# Patient Record
Sex: Male | Born: 1945
Health system: Southern US, Community
[De-identification: ages and names within clinical notes are randomized; demographics above are authoritative.]

## PROBLEM LIST (undated history)

## (undated) DIAGNOSIS — I10 Essential (primary) hypertension: Secondary | ICD-10-CM

## (undated) DIAGNOSIS — M1990 Unspecified osteoarthritis, unspecified site: Secondary | ICD-10-CM

## (undated) DIAGNOSIS — T7840XA Allergy, unspecified, initial encounter: Secondary | ICD-10-CM

## (undated) DIAGNOSIS — E669 Obesity, unspecified: Secondary | ICD-10-CM

## (undated) DIAGNOSIS — E7800 Pure hypercholesterolemia, unspecified: Secondary | ICD-10-CM

## (undated) HISTORY — DX: Obesity, unspecified: E66.9

## (undated) HISTORY — DX: Pure hypercholesterolemia, unspecified: E78.00

## (undated) HISTORY — DX: Unspecified osteoarthritis, unspecified site: M19.90

## (undated) HISTORY — DX: Allergy, unspecified, initial encounter: T78.40XA

## (undated) HISTORY — DX: Essential (primary) hypertension: I10

---

## 2003-04-02 ENCOUNTER — Encounter: Admission: RE | Admit: 2003-04-02 | Discharge: 2003-04-02 | Payer: Self-pay | Admitting: Family Medicine

## 2007-08-09 ENCOUNTER — Ambulatory Visit: Payer: Self-pay | Admitting: Family Medicine

## 2007-09-04 ENCOUNTER — Ambulatory Visit: Payer: Self-pay | Admitting: Family Medicine

## 2007-09-24 ENCOUNTER — Encounter (INDEPENDENT_AMBULATORY_CARE_PROVIDER_SITE_OTHER): Payer: Self-pay | Admitting: *Deleted

## 2007-09-24 ENCOUNTER — Ambulatory Visit: Payer: Self-pay | Admitting: Gastroenterology

## 2007-10-08 ENCOUNTER — Ambulatory Visit: Payer: Self-pay | Admitting: Gastroenterology

## 2008-04-14 ENCOUNTER — Ambulatory Visit: Payer: Self-pay | Admitting: Family Medicine

## 2008-07-01 ENCOUNTER — Ambulatory Visit: Payer: Self-pay | Admitting: Family Medicine

## 2008-10-03 ENCOUNTER — Ambulatory Visit: Payer: Self-pay | Admitting: Family Medicine

## 2008-12-04 ENCOUNTER — Ambulatory Visit: Payer: Self-pay | Admitting: Family Medicine

## 2008-12-25 ENCOUNTER — Ambulatory Visit: Payer: Self-pay | Admitting: Family Medicine

## 2009-01-13 ENCOUNTER — Ambulatory Visit: Payer: Self-pay | Admitting: Family Medicine

## 2009-02-14 HISTORY — PX: EYE SURGERY: SHX253

## 2009-07-15 ENCOUNTER — Ambulatory Visit: Payer: Self-pay | Admitting: Family Medicine

## 2009-07-15 ENCOUNTER — Encounter: Admission: RE | Admit: 2009-07-15 | Discharge: 2009-07-15 | Payer: Self-pay | Admitting: Family Medicine

## 2009-07-23 ENCOUNTER — Ambulatory Visit: Payer: Self-pay | Admitting: Family Medicine

## 2009-08-20 ENCOUNTER — Ambulatory Visit: Payer: Self-pay | Admitting: Family Medicine

## 2010-02-10 ENCOUNTER — Ambulatory Visit
Admission: RE | Admit: 2010-02-10 | Discharge: 2010-02-10 | Payer: Self-pay | Source: Home / Self Care | Attending: Family Medicine | Admitting: Family Medicine

## 2010-07-24 ENCOUNTER — Other Ambulatory Visit: Payer: Self-pay | Admitting: Family Medicine

## 2010-12-07 ENCOUNTER — Other Ambulatory Visit: Payer: Self-pay | Admitting: Family Medicine

## 2010-12-07 NOTE — Telephone Encounter (Signed)
Patient needs an Office Visit in order to get any more refills.

## 2010-12-09 ENCOUNTER — Encounter: Payer: Self-pay | Admitting: Family Medicine

## 2010-12-10 ENCOUNTER — Ambulatory Visit (INDEPENDENT_AMBULATORY_CARE_PROVIDER_SITE_OTHER): Payer: Medicare Other | Admitting: Family Medicine

## 2010-12-10 ENCOUNTER — Encounter: Payer: Self-pay | Admitting: Family Medicine

## 2010-12-10 VITALS — BP 122/82 | HR 62 | Wt 227.0 lb

## 2010-12-10 DIAGNOSIS — Z125 Encounter for screening for malignant neoplasm of prostate: Secondary | ICD-10-CM

## 2010-12-10 DIAGNOSIS — Z23 Encounter for immunization: Secondary | ICD-10-CM

## 2010-12-10 DIAGNOSIS — E669 Obesity, unspecified: Secondary | ICD-10-CM

## 2010-12-10 DIAGNOSIS — Z79899 Other long term (current) drug therapy: Secondary | ICD-10-CM

## 2010-12-10 DIAGNOSIS — E785 Hyperlipidemia, unspecified: Secondary | ICD-10-CM

## 2010-12-10 DIAGNOSIS — I10 Essential (primary) hypertension: Secondary | ICD-10-CM

## 2010-12-10 LAB — LIPID PANEL
Cholesterol: 202 mg/dL — ABNORMAL HIGH (ref 0–200)
LDL Cholesterol: 145 mg/dL — ABNORMAL HIGH (ref 0–99)
Total CHOL/HDL Ratio: 5.1 Ratio
Triglycerides: 86 mg/dL (ref <150)
VLDL: 17 mg/dL (ref 0–40)

## 2010-12-10 LAB — CBC WITH DIFFERENTIAL/PLATELET
Basophils Relative: 1 % (ref 0–1)
Eosinophils Absolute: 0.2 10*3/uL (ref 0.0–0.7)
Eosinophils Relative: 3 % (ref 0–5)
Lymphs Abs: 1.7 10*3/uL (ref 0.7–4.0)
MCH: 27.3 pg (ref 26.0–34.0)
MCHC: 32.8 g/dL (ref 30.0–36.0)
MCV: 83.3 fL (ref 78.0–100.0)
Neutrophils Relative %: 61 % (ref 43–77)
Platelets: 226 10*3/uL (ref 150–400)

## 2010-12-10 LAB — COMPREHENSIVE METABOLIC PANEL
ALT: 16 U/L (ref 0–53)
Alkaline Phosphatase: 67 U/L (ref 39–117)
CO2: 22 mEq/L (ref 19–32)
Creat: 1.67 mg/dL — ABNORMAL HIGH (ref 0.50–1.35)
Glucose, Bld: 93 mg/dL (ref 70–99)
Sodium: 136 mEq/L (ref 135–145)
Total Bilirubin: 0.5 mg/dL (ref 0.3–1.2)
Total Protein: 7 g/dL (ref 6.0–8.3)

## 2010-12-10 MED ORDER — LISINOPRIL-HYDROCHLOROTHIAZIDE 10-12.5 MG PO TABS: 1.0000 | ORAL_TABLET | Freq: Every day | ORAL | Status: DC

## 2010-12-10 NOTE — Progress Notes (Signed)
  Subjective:    Patient ID: Stephen Padilla, male    DOB: 1945-03-09, 65 y.o.   MRN: [id number]  HPI Is here for an interval evaluation. He continues on his lisinopril and is having no difficulty with this. He has no other concerns or complaints. He does have a history of hyperlipidemia but presently is on no medications. He did have a previous PSA test. He is semiretired and enjoying himself. His marriage is going well. He does not smoke.   Review of Systems     Objective:   Physical Exam alert and in no distress. Tympanic membranes and canals are normal. Throat is clear. Tonsils are normal. Neck is supple without adenopathy or thyromegaly. Cardiac exam shows a regular sinus rhythm without murmurs or gallops. Lungs are clear to auscultation.        Assessment & Plan:   1. Hypertension  CBC with Differential, Comprehensive metabolic panel  2. Hyperlipidemia LDL goal < 100  Lipid panel  3. Encounter for long-term (current) use of other medications    4. Obesity (BMI 30-39.9)    5. Special screening for malignant neoplasm of prostate  PSA  6. Need for prophylactic vaccination and inoculation against influenza

## 2010-12-10 NOTE — Patient Instructions (Signed)
Continue take good care of yourself. Your blood pressure medicine has been called in

## 2010-12-17 ENCOUNTER — Encounter: Payer: Self-pay | Admitting: Family Medicine

## 2011-02-03 ENCOUNTER — Encounter: Payer: Self-pay | Admitting: Family Medicine

## 2011-02-03 ENCOUNTER — Ambulatory Visit (INDEPENDENT_AMBULATORY_CARE_PROVIDER_SITE_OTHER): Payer: Medicare Other | Admitting: Family Medicine

## 2011-02-03 VITALS — BP 128/84 | HR 72 | Temp 98.0°F | Ht 69.0 in | Wt 230.0 lb

## 2011-02-03 DIAGNOSIS — J069 Acute upper respiratory infection, unspecified: Secondary | ICD-10-CM

## 2011-02-03 NOTE — Progress Notes (Signed)
Chief complaint: Cough runny nose, hard to swallow x 2 -3 days  HPI:  Symptoms began 2 days ago, with runny nose, postnasal drip, sore throat and slight cough.  Cough is dry.  Nasal mucus is clear.  Denies shortness of breath.  Denies fevers.  Patient is concerned because typically with these illnesses it then moves into his chest, gets bronchitis and needs antibiotics.  Hoping to avoid that this year.  Last year cough persisted x 6 weeks.  No sick contacts.  Has taken some Sudafed with some improvement.  Past Medical History  Diagnosis Date  . Obesity   . Hypercholesterolemia   . Hemorrhoids   . Alopecia   . Allergy     RHINITIS  . Hypertension   . Cataract    Past Surgical History  Procedure Date  . Eye surgery 2011    BILAT CATARACTS   History   Social History  . Marital Status: Married    Spouse Name: N/A    Number of Children: N/A  . Years of Education: N/A   Occupational History  . Not on file.   Social History Main Topics  . Smoking status: Never Smoker   . Smokeless tobacco: Never Used  . Alcohol Use: 0.0 oz/week     occasionally, maybe 1-2 drinks socially (usually once a month)  . Drug Use: No  . Sexually Active: Not on file   Other Topics Concern  . Not on file   Social History Narrative  . No narrative on file   Current Outpatient Prescriptions on File Prior to Visit  Medication Sig Dispense Refill  . lisinopril-hydrochlorothiazide (PRINZIDE,ZESTORETIC) 10-12.5 MG per tablet Take 1 tablet by mouth daily.  30 tablet  11   Allergies  Allergen Reactions  . Penicillins     REACTION: rash   ROS:  Denies fevers, rashes, nausea, vomiting, diarrhea, myalgias, chest pain, headaches or other concerns.  Had a headache yesterday, resolved  PHYSICAL EXAM: BP 128/84  Pulse 72  Temp(Src) 98 F (36.7 C) (Oral)  Ht 5\' 9"  (1.753 m)  Wt 230 lb (104.327 kg)  BMI 33.96 kg/m2 HEENT:  PERRL, EOMI, conjunctiva clear. TM's obscured by cerumen.  OP clear without  erythema or lesions.  Sinuses nontender.  Nasal mucosa only mildly edematous, clear-white mucus. Neck: no lymphadenopathy Heart: regular rate and rhythm without murmurs Lungs: clear bilaterally with good air movement Skin: no rashes  ASSESSMENT/PLAN:  1. URI (upper respiratory infection)    URI.  Supportive measures.  Continue decongestants, periodically monitoring BP's.  Discussed viral nature of current illness.  Antibiotics only useful for bacterial infections.  If symptoms persist or worsen beyond 7-10 days, then can call for possible antibiotics.  I recommend using Mucinex (guaifenesin) for cough, and if/when chest congestion begins

## 2011-02-03 NOTE — Patient Instructions (Signed)
Continue decongestants, periodically monitoring BP's.  Discussed viral nature of current illness.  Antibiotics only useful for bacterial infections.  If symptoms persist or worsen beyond 7-10 days, then can call for possible antibiotics.  I recommend using Mucinex (guaifenesin) for cough, and if/when chest congestion begins.  You may use a product containing dextromethorphan (ie Delsym syrup, or Mucinex DM) if you need a cough suppressant. If having significant shortness of breath, chest pain, high fevers--please return for re-evaluation (rather than calling for prescription)

## 2011-05-16 ENCOUNTER — Telehealth: Payer: Self-pay | Admitting: Internal Medicine

## 2011-05-16 MED ORDER — LISINOPRIL-HYDROCHLOROTHIAZIDE 10-12.5 MG PO TABS: 1.0000 | ORAL_TABLET | Freq: Every day | ORAL | Status: DC

## 2011-05-16 NOTE — Telephone Encounter (Signed)
Med sent in.

## 2011-05-17 ENCOUNTER — Telehealth: Payer: Self-pay | Admitting: Internal Medicine

## 2011-05-17 MED ORDER — LISINOPRIL-HYDROCHLOROTHIAZIDE 10-12.5 MG PO TABS: 1.0000 | ORAL_TABLET | Freq: Every day | ORAL | Status: DC

## 2011-05-17 NOTE — Telephone Encounter (Signed)
Request for a 90 day supply for lisinoprol-hctz 10-12.5mg . Yesterday it was done with #30 with 5rf and so i just changed its to #90 with 1 rf.

## 2012-01-12 ENCOUNTER — Other Ambulatory Visit: Payer: Self-pay | Admitting: Family Medicine

## 2012-01-23 ENCOUNTER — Encounter: Payer: Self-pay | Admitting: Family Medicine

## 2012-01-23 ENCOUNTER — Ambulatory Visit (INDEPENDENT_AMBULATORY_CARE_PROVIDER_SITE_OTHER): Payer: Medicare Other | Admitting: Family Medicine

## 2012-01-23 VITALS — BP 124/82 | HR 69 | Wt 217.0 lb

## 2012-01-23 DIAGNOSIS — E785 Hyperlipidemia, unspecified: Secondary | ICD-10-CM

## 2012-01-23 DIAGNOSIS — Z125 Encounter for screening for malignant neoplasm of prostate: Secondary | ICD-10-CM

## 2012-01-23 DIAGNOSIS — I10 Essential (primary) hypertension: Secondary | ICD-10-CM

## 2012-01-23 DIAGNOSIS — E669 Obesity, unspecified: Secondary | ICD-10-CM

## 2012-01-23 DIAGNOSIS — Z23 Encounter for immunization: Secondary | ICD-10-CM

## 2012-01-23 DIAGNOSIS — Z79899 Other long term (current) drug therapy: Secondary | ICD-10-CM

## 2012-01-23 LAB — COMPREHENSIVE METABOLIC PANEL
Albumin: 4.1 g/dL (ref 3.5–5.2)
Alkaline Phosphatase: 75 U/L (ref 39–117)
CO2: 29 mEq/L (ref 19–32)
Calcium: 9.4 mg/dL (ref 8.4–10.5)
Chloride: 96 mEq/L (ref 96–112)
Glucose, Bld: 84 mg/dL (ref 70–99)
Potassium: 4.1 mEq/L (ref 3.5–5.3)
Sodium: 138 mEq/L (ref 135–145)
Total Protein: 7.2 g/dL (ref 6.0–8.3)

## 2012-01-23 LAB — CBC WITH DIFFERENTIAL/PLATELET
Basophils Relative: 1 % (ref 0–1)
HCT: 41.4 % (ref 39.0–52.0)
Hemoglobin: 14.1 g/dL (ref 13.0–17.0)
Lymphocytes Relative: 30 % (ref 12–46)
Lymphs Abs: 2.3 10*3/uL (ref 0.7–4.0)
Monocytes Absolute: 0.5 10*3/uL (ref 0.1–1.0)
Monocytes Relative: 7 % (ref 3–12)
Neutro Abs: 4.3 10*3/uL (ref 1.7–7.7)
Neutrophils Relative %: 58 % (ref 43–77)
RBC: 5.13 MIL/uL (ref 4.22–5.81)

## 2012-01-23 LAB — LIPID PANEL
LDL Cholesterol: 132 mg/dL — ABNORMAL HIGH (ref 0–99)
VLDL: 44 mg/dL — ABNORMAL HIGH (ref 0–40)

## 2012-01-23 MED ORDER — INFLUENZA VIRUS VACC SPLIT PF IM SUSP: 0.5000 mL | Freq: Once | INTRAMUSCULAR | Status: DC

## 2012-01-23 MED ORDER — LISINOPRIL-HYDROCHLOROTHIAZIDE 10-12.5 MG PO TABS: 1.0000 | ORAL_TABLET | Freq: Every day | ORAL | Status: DC

## 2012-01-23 NOTE — Progress Notes (Signed)
  Subjective:    Patient ID: Stephen Padilla, male    DOB: 1945/05/30, 66 y.o.   MRN: [id number]  HPI He is here for an interval evaluation. He continues on lisinopril and is having no difficulty with this. He has no particular concerns or complaints. He would like to have a flu shot. Social history was reviewed. He does not smoke and drinks socially. He is semiretired and very much enjoying this. His immunizations were reviewed.  Review of Systems     Objective:   Physical Exam Alert and in no distress. Cardiac exam shows regular rhythm without murmurs or gallops. Lungs are clear to auscultation.       Assessment & Plan:   1. Special screening for malignant neoplasm of prostate  PSA, Medicare  2. Hypertension  CBC with Differential, Comprehensive metabolic panel, lisinopril-hydrochlorothiazide (PRINZIDE,ZESTORETIC) 10-12.5 MG per tablet  3. Hyperlipidemia LDL goal < 100  Lipid panel  4. Obesity (BMI 30-39.9)    5. Encounter for long-term (current) use of other medications  influenza  inactive virus vaccine (FLUZONE/FLUARIX) injection, Lipid panel, CBC with Differential, Comprehensive metabolic panel, lisinopril-hydrochlorothiazide (PRINZIDE,ZESTORETIC) 10-12.5 MG per tablet, DISCONTINUED: lisinopril-hydrochlorothiazide (PRINZIDE,ZESTORETIC) 10-12.5 MG per tablet  6. Need for prophylactic vaccination and inoculation against influenza  influenza  inactive virus vaccine (FLUZONE/FLUARIX) injection 0.5 mL   flu shot was given with risks and benefits discussed

## 2012-01-24 NOTE — Progress Notes (Signed)
Quick Note:  Renal functions are worse. Have him increase his fluid intake and we will recheck this in 2 or 3 weeks. Lipid panel is elevated but I will not change anything now ______

## 2012-07-26 ENCOUNTER — Other Ambulatory Visit: Payer: Self-pay | Admitting: Family Medicine

## 2012-09-19 ENCOUNTER — Other Ambulatory Visit: Payer: Self-pay

## 2012-12-20 ENCOUNTER — Other Ambulatory Visit: Payer: Self-pay

## 2013-01-29 ENCOUNTER — Other Ambulatory Visit: Payer: Self-pay | Admitting: Family Medicine

## 2013-05-26 ENCOUNTER — Other Ambulatory Visit: Payer: Self-pay | Admitting: Family Medicine

## 2013-05-27 NOTE — Telephone Encounter (Signed)
Pt coming in 05/2013

## 2013-05-27 NOTE — Telephone Encounter (Signed)
Is this ok to refill?  

## 2013-05-27 NOTE — Telephone Encounter (Signed)
Time for an office visit for him

## 2013-06-03 ENCOUNTER — Other Ambulatory Visit: Payer: Self-pay | Admitting: Family Medicine

## 2013-06-03 ENCOUNTER — Encounter: Payer: Self-pay | Admitting: Family Medicine

## 2013-06-03 ENCOUNTER — Ambulatory Visit (INDEPENDENT_AMBULATORY_CARE_PROVIDER_SITE_OTHER): Payer: 59 | Admitting: Family Medicine

## 2013-06-03 VITALS — BP 120/64 | HR 88 | Wt 235.0 lb

## 2013-06-03 DIAGNOSIS — I10 Essential (primary) hypertension: Secondary | ICD-10-CM

## 2013-06-03 DIAGNOSIS — E785 Hyperlipidemia, unspecified: Secondary | ICD-10-CM

## 2013-06-03 DIAGNOSIS — Z79899 Other long term (current) drug therapy: Secondary | ICD-10-CM

## 2013-06-03 DIAGNOSIS — E669 Obesity, unspecified: Secondary | ICD-10-CM

## 2013-06-03 MED ORDER — LISINOPRIL-HYDROCHLOROTHIAZIDE 10-12.5 MG PO TABS: 1.0000 | ORAL_TABLET | Freq: Every day | ORAL | Status: DC

## 2013-06-03 NOTE — Progress Notes (Signed)
   Subjective:    Patient ID: Stephen Padilla, male    DOB: 08/22/1945, 68 y.o.   MRN: [id number]  HPI  [name] is a very pleasant 68 y.o. yo male who  has a past medical history of Obesity; Hypercholesterolemia; Hemorrhoids; Alopecia; Allergy; Hypertension; and Cataract. He presents today for a med check   The patient is doing well today with no acute complaints. The patient continues to do well in both his job and social life and reports being happily married for the last 8 years. The patient does not use tobacco products and occasionally drinks on social occasions. The patient feels that his diet is overall good, he has mostly cut out all sugary sodas and now drinks only water. In terms of exercise the patient feels like he lives a healthy lifestyle and snjoys working outside. He plans to join a gym and loose 20 pounds soon in anticipation of his step-daughters wedding.   Colonoscopy: 2009 Shingles Administered Tetanus: UTD 23 valent Pneumococcal: Will give today.   Review of Systems is negative except per HPI.    Objective:   Physical Exam  Constitutional: Patient is oriented to person, place, and time and well-developed, well-nourished, and in no distress. Cardiovascular: Normal rate, regular rhythm. Exam reveals no murmurs, gallops and no friction rub.  Pulmonary/Chest: Effort normal and breath sounds normal. No respiratory distress. No wheezes or ronchi.   Psychiatric: Affect normal.   Assessment & Plan:  Encounter for long-term (current) use of other medications  Hypertension  Hyperlipidemia LDL goal < 100  Obesity (BMI 30-39.9)  Will renew his medications today, his BP appears to be well-controlled on his current regimen. Will do blood work today to recheck lipids.

## 2013-06-04 LAB — COMPREHENSIVE METABOLIC PANEL
ALBUMIN: 4.1 g/dL (ref 3.5–5.2)
ALK PHOS: 76 U/L (ref 39–117)
ALT: 18 U/L (ref 0–53)
AST: 21 U/L (ref 0–37)
BUN: 27 mg/dL — AB (ref 6–23)
CALCIUM: 9.1 mg/dL (ref 8.4–10.5)
CO2: 24 meq/L (ref 19–32)
Chloride: 98 mEq/L (ref 96–112)
Creat: 1.86 mg/dL — ABNORMAL HIGH (ref 0.50–1.35)
GLUCOSE: 130 mg/dL — AB (ref 70–99)
POTASSIUM: 4 meq/L (ref 3.5–5.3)
SODIUM: 135 meq/L (ref 135–145)
TOTAL PROTEIN: 6.9 g/dL (ref 6.0–8.3)
Total Bilirubin: 0.5 mg/dL (ref 0.2–1.2)

## 2013-06-04 LAB — CBC WITH DIFFERENTIAL/PLATELET
BASOS PCT: 1 % (ref 0–1)
Basophils Absolute: 0.1 10*3/uL (ref 0.0–0.1)
Eosinophils Absolute: 0.3 10*3/uL (ref 0.0–0.7)
Eosinophils Relative: 4 % (ref 0–5)
HEMATOCRIT: 42.7 % (ref 39.0–52.0)
HEMOGLOBIN: 14.6 g/dL (ref 13.0–17.0)
LYMPHS ABS: 2.3 10*3/uL (ref 0.7–4.0)
Lymphocytes Relative: 28 % (ref 12–46)
MCH: 28.1 pg (ref 26.0–34.0)
MCHC: 34.2 g/dL (ref 30.0–36.0)
MCV: 82.1 fL (ref 78.0–100.0)
MONO ABS: 0.5 10*3/uL (ref 0.1–1.0)
MONOS PCT: 6 % (ref 3–12)
NEUTROS ABS: 5.1 10*3/uL (ref 1.7–7.7)
Neutrophils Relative %: 61 % (ref 43–77)
Platelets: 226 10*3/uL (ref 150–400)
RBC: 5.2 MIL/uL (ref 4.22–5.81)
RDW: 15 % (ref 11.5–15.5)
WBC: 8.3 10*3/uL (ref 4.0–10.5)

## 2013-06-04 LAB — LIPID PANEL
CHOLESTEROL: 217 mg/dL — AB (ref 0–200)
HDL: 36 mg/dL — AB (ref >39)
LDL CALC: 115 mg/dL — AB (ref 0–99)
TRIGLYCERIDES: 329 mg/dL — AB (ref <150)
Total CHOL/HDL Ratio: 6 Ratio
VLDL: 66 mg/dL — ABNORMAL HIGH (ref 0–40)

## 2013-06-04 LAB — HEMOGLOBIN A1C
Hgb A1c MFr Bld: 6.2 % — ABNORMAL HIGH (ref <5.7)
Mean Plasma Glucose: 131 mg/dL — ABNORMAL HIGH (ref <117)

## 2013-08-19 ENCOUNTER — Encounter: Payer: Self-pay | Admitting: Family Medicine

## 2013-08-19 ENCOUNTER — Ambulatory Visit (INDEPENDENT_AMBULATORY_CARE_PROVIDER_SITE_OTHER): Payer: 59 | Admitting: Family Medicine

## 2013-08-19 VITALS — BP 110/70 | HR 91 | Wt 229.0 lb

## 2013-08-19 DIAGNOSIS — L821 Other seborrheic keratosis: Secondary | ICD-10-CM

## 2013-08-19 DIAGNOSIS — L309 Dermatitis, unspecified: Secondary | ICD-10-CM

## 2013-08-19 DIAGNOSIS — L259 Unspecified contact dermatitis, unspecified cause: Secondary | ICD-10-CM

## 2013-08-19 NOTE — Progress Notes (Signed)
   Subjective:    Patient ID: Stephen Padilla, male    DOB: 03/21/1945, 68 y.o.   MRN: [id number]  HPI He is here for evaluation of 2 issues. He has a lesion in the left temporal area that he's noted was slightly raised and irritated. He also is noted some erythema and slight itching to the medial aspect of both forearms. He has been using a cream on it which helps but does not make it go away.   Review of Systems     Objective:   Physical Exam He has a 1 x 2 cm raised pigmented lesion with a small area of irritation and scabbing in the left temporal area. Exam of both medial forearms does show slight erythema and drying.       Assessment & Plan:  Seborrheic keratosis  Eczema  recommend watchful waiting concerning the seborrheic keratosis and if the irregularity does not go away, return here. Also recommend cortisone cream regularly until he rashes under control and then back off as much is possible to keep it under control.

## 2013-08-19 NOTE — Patient Instructions (Signed)
Use cortisone cream on the forearm area daily until it's under control and then back off and use it to keep it under.If the lesion on your face doesn't calm down, come back to let me look again

## 2014-06-13 ENCOUNTER — Other Ambulatory Visit: Payer: Self-pay | Admitting: Family Medicine

## 2014-07-18 ENCOUNTER — Encounter: Payer: Self-pay | Admitting: Gastroenterology

## 2014-11-10 ENCOUNTER — Telehealth: Payer: Self-pay | Admitting: Family Medicine

## 2014-11-10 NOTE — Telephone Encounter (Signed)
Left message for pt to call. Needs either a medcheck or cpe.

## 2015-02-04 ENCOUNTER — Ambulatory Visit (INDEPENDENT_AMBULATORY_CARE_PROVIDER_SITE_OTHER): Payer: Medicare Other | Admitting: Family Medicine

## 2015-02-04 ENCOUNTER — Encounter: Payer: Self-pay | Admitting: Family Medicine

## 2015-02-04 VITALS — BP 124/76 | HR 80 | Resp 14 | Wt 232.8 lb

## 2015-02-04 DIAGNOSIS — I10 Essential (primary) hypertension: Secondary | ICD-10-CM | POA: Diagnosis not present

## 2015-02-04 DIAGNOSIS — E785 Hyperlipidemia, unspecified: Secondary | ICD-10-CM | POA: Diagnosis not present

## 2015-02-04 DIAGNOSIS — Z23 Encounter for immunization: Secondary | ICD-10-CM

## 2015-02-04 DIAGNOSIS — E669 Obesity, unspecified: Secondary | ICD-10-CM | POA: Diagnosis not present

## 2015-02-04 LAB — CBC WITH DIFFERENTIAL/PLATELET
BASOS ABS: 0.1 10*3/uL (ref 0.0–0.1)
BASOS PCT: 1 % (ref 0–1)
EOS ABS: 0.4 10*3/uL (ref 0.0–0.7)
EOS PCT: 5 % (ref 0–5)
HCT: 43.5 % (ref 39.0–52.0)
Hemoglobin: 14.2 g/dL (ref 13.0–17.0)
LYMPHS ABS: 2.1 10*3/uL (ref 0.7–4.0)
Lymphocytes Relative: 28 % (ref 12–46)
MCH: 27.3 pg (ref 26.0–34.0)
MCHC: 32.6 g/dL (ref 30.0–36.0)
MCV: 83.5 fL (ref 78.0–100.0)
MPV: 8.6 fL (ref 8.6–12.4)
Monocytes Absolute: 0.5 10*3/uL (ref 0.1–1.0)
Monocytes Relative: 6 % (ref 3–12)
NEUTROS PCT: 60 % (ref 43–77)
Neutro Abs: 4.6 10*3/uL (ref 1.7–7.7)
PLATELETS: 224 10*3/uL (ref 150–400)
RBC: 5.21 MIL/uL (ref 4.22–5.81)
RDW: 15.2 % (ref 11.5–15.5)
WBC: 7.6 10*3/uL (ref 4.0–10.5)

## 2015-02-04 MED ORDER — LISINOPRIL-HYDROCHLOROTHIAZIDE 10-12.5 MG PO TABS: 1.0000 | ORAL_TABLET | Freq: Every day | ORAL | Status: DC

## 2015-02-04 NOTE — Patient Instructions (Signed)
  [name] , Thank you for taking time to come for your Medicare Wellness Visit. I appreciate your ongoing commitment to your health goals. Please review the following plan we discussed and let me know if I can assist you in the future.   These are the goals we discussed: Goals    None      This is a list of the screening recommended for you and due dates:  Health Maintenance  Topic Date Due  .  Hepatitis C: One time screening is recommended by Center for Disease Control  (CDC) for  adults born from [birth year] through 1967.   09/30/1945  . Flu Shot  09/15/2014  . Tetanus Vaccine  09/03/2017  . Colon Cancer Screening  10/07/2017  . Shingles Vaccine  Completed  . Pneumonia vaccines  Completed

## 2015-02-04 NOTE — Progress Notes (Signed)
Stephen Padilla is a 69 y.o. male who presents for annual wellness visit and follow-up on chronic medical conditions.  He has the following concerns:   Immunization History  Administered Date(s) Administered  . Influenza Split 12/10/2010, 01/23/2012  . PPD Test 01/04/1980  . Pneumococcal Conjugate-13 09/04/2007  . Pneumococcal Polysaccharide-23 06/03/2013  . Tdap 09/04/2007  . Zoster 12/17/2010   Last colonoscopy: 2-3 years ago Last PSA: 01/23/2012 Dentist: October 2016 Ophtho: February 2015 Exercise: No exercise  Other doctors caring for patient include:no one else   Depression screen:  See questionnaire below.     Depression screen PHQ 2/9 02/04/2015  Decreased Interest 0  Down, Depressed, Hopeless 0  PHQ - 2 Score 0    Fall Screen: See Questionaire below.   Fall Risk  02/04/2015  Falls in the past year? No    ADL screen:  See questionnaire below.  Functional Status Survey: Is the patient deaf or have difficulty hearing?: No Does the patient have difficulty seeing, even when wearing glasses/contacts?: No Does the patient have difficulty concentrating, remembering, or making decisions?: No Does the patient have difficulty walking or climbing stairs?: No Does the patient have difficulty dressing or bathing?: No Does the patient have difficulty doing errands alone such as visiting a doctor's office or shopping?: No   End of Life Discussion:  Patient does not have a living will and medical power of attorney   Review of Systems  Constitutional: -fever, -chills, -sweats, -unexpected weight change, -anorexia, -fatigue Allergy: -sneezing, -itching, -congestion Dermatology: denies changing moles, rash, lumps, new worrisome lesions ENT: -runny nose, -ear pain, -sore throat, -hoarseness, -sinus pain, -teeth pain, -tinnitus, -hearing loss, -epistaxis Cardiology:  -chest pain, -palpitations, -edema, -orthopnea, -paroxysmal nocturnal dyspnea Respiratory: -cough, -shortness  of breath, -dyspnea on exertion, -wheezing, -hemoptysis Gastroenterology: -abdominal pain, -nausea, -vomiting, -diarrhea, -constipation, -blood in stool, -changes in bowel movement, -dysphagia Hematology: -bleeding or bruising problems Musculoskeletal: -arthralgias, -myalgias, -joint swelling, -back pain, -neck pain, -cramping, -gait changes Ophthalmology: -vision changes, -eye redness, -itching, -discharge Urology: -dysuria, -difficulty urinating, -hematuria, -urinary frequency, -urgency, incontinence Neurology: -headache, -weakness, -tingling, -numbness, -speech abnormality, -memory loss, -falls, -dizziness Psychology:  -depressed mood, -agitation, -sleep problems   PHYSICAL EXAM:  BP 124/76 mmHg  Pulse 80  Resp 14  Wt 232 lb 12.8 oz (105.597 kg)  General Appearance: Alert, cooperative, no distress, appears stated age Head: Normocephalic, without obvious abnormality, atraumatic Eyes: PERRL, conjunctiva/corneas clear, EOM's intact, fundi benign Ears: Normal TM's and external ear canals Nose: Nares normal, mucosa normal, no drainage or sinus   tenderness Throat: Lips, mucosa, and tongue normal; teeth and gums normal Neck: Supple, no lymphadenopathy, thyroid:no enlargement/tenderness/nodules; no carotid bruit or JVD Back: Spine nontender, no curvature, ROM normal, no CVA tenderness Lungs: Clear to auscultation bilaterally without wheezes, rales or ronchi; respirations unlabored Chest Wall: No tenderness or deformity Heart: Regular rate and rhythm, S1 and S2 normal, no murmur, rub or gallop Breast Exam: No chest wall tenderness, masses or gynecomastia Abdomen: Soft, non-tender, nondistended, normoactive bowel sounds, no masses, no hepatosplenomegaly Extremities: No clubbing, cyanosis or edema Pulses: 2+ and symmetric all extremities Skin: Skin color, texture, turgor normal, no rashes or lesions Lymph nodes: Cervical, supraclavicular, and axillary nodes normal Neurologic: CNII-XII  intact, normal strength, sensation and gait; reflexes 2+ and symmetric throughout   Psych: Normal mood, affect, hygiene and grooming  ASSESSMENT/PLAN: Hyperlipidemia with target LDL less than 100 - Plan: Lipid panel  Essential hypertension - Plan: CBC with Differential/Platelet, Comprehensive metabolic panel, lisinopril-hydrochlorothiazide (  PRINZIDE,ZESTORETIC) 10-12.5 MG tablet  Obesity (BMI 30-39.9)  Need for prophylactic vaccination and inoculation against influenza - Plan: Flu vaccine HIGH DOSE PF (Fluzone High dose)     Discussed  recommended at least 30 minutes of aerobic activity at least 5 days/week;  healthy diet and alcohol recommendations (less than or equal to 2 drinks/day) reviewed; regular seatbelt use;  Immunization recommendations discussed.  Colonoscopy recommendations reviewed.   Medicare Attestation I have personally reviewed: The patient's medical and social history Their use of alcohol, tobacco or illicit drugs Their current medications and supplements The patient's functional ability including ADLs,fall risks, home safety risks, cognitive, and hearing and visual impairment Diet and physical activities Evidence for depression or mood disorders  The patient's weight, height, and BMI have been recorded in the chart.  I have made referrals, counseling, and provided education to the patient based on review of the above and I have provided the patient with a written personalized care plan for preventive services.     Carollee Herter, MD   02/04/2015

## 2015-02-05 LAB — COMPREHENSIVE METABOLIC PANEL
ALBUMIN: 3.8 g/dL (ref 3.6–5.1)
ALK PHOS: 67 U/L (ref 40–115)
ALT: 19 U/L (ref 9–46)
AST: 22 U/L (ref 10–35)
BILIRUBIN TOTAL: 0.5 mg/dL (ref 0.2–1.2)
BUN: 23 mg/dL (ref 7–25)
CO2: 27 mmol/L (ref 20–31)
CREATININE: 1.4 mg/dL — AB (ref 0.70–1.18)
Calcium: 9.2 mg/dL (ref 8.6–10.3)
Chloride: 102 mmol/L (ref 98–110)
Glucose, Bld: 105 mg/dL — ABNORMAL HIGH (ref 65–99)
Potassium: 4.3 mmol/L (ref 3.5–5.3)
SODIUM: 137 mmol/L (ref 135–146)
TOTAL PROTEIN: 6.9 g/dL (ref 6.1–8.1)

## 2015-02-05 LAB — LIPID PANEL
CHOLESTEROL: 218 mg/dL — AB (ref 125–200)
HDL: 37 mg/dL — ABNORMAL LOW (ref >=40)
LDL Cholesterol: 157 mg/dL — ABNORMAL HIGH (ref <130)
TRIGLYCERIDES: 118 mg/dL (ref <150)
Total CHOL/HDL Ratio: 5.9 Ratio — ABNORMAL HIGH (ref <=5.0)
VLDL: 24 mg/dL (ref <30)

## 2015-06-03 ENCOUNTER — Other Ambulatory Visit: Payer: Self-pay | Admitting: Family Medicine

## 2015-09-12 ENCOUNTER — Other Ambulatory Visit: Payer: Self-pay | Admitting: Family Medicine

## 2016-04-14 ENCOUNTER — Other Ambulatory Visit: Payer: Self-pay

## 2016-04-14 ENCOUNTER — Telehealth: Payer: Self-pay | Admitting: Family Medicine

## 2016-04-14 MED ORDER — LISINOPRIL-HYDROCHLOROTHIAZIDE 10-12.5 MG PO TABS: 1.0000 | ORAL_TABLET | Freq: Every day | ORAL | 0 refills | Status: DC

## 2016-04-14 NOTE — Telephone Encounter (Signed)
Pt called and stated he has an appt for 04/19/2016 but is completely out of lisinopril-hctz. Please send to [facility] [address]

## 2016-04-19 ENCOUNTER — Encounter: Payer: Self-pay | Admitting: Family Medicine

## 2016-04-19 ENCOUNTER — Ambulatory Visit (INDEPENDENT_AMBULATORY_CARE_PROVIDER_SITE_OTHER): Payer: Medicare Other | Admitting: Family Medicine

## 2016-04-19 VITALS — BP 114/70 | HR 75 | Ht 69.0 in | Wt 231.0 lb

## 2016-04-19 DIAGNOSIS — Z1159 Encounter for screening for other viral diseases: Secondary | ICD-10-CM

## 2016-04-19 DIAGNOSIS — Z0000 Encounter for general adult medical examination without abnormal findings: Secondary | ICD-10-CM

## 2016-04-19 DIAGNOSIS — E785 Hyperlipidemia, unspecified: Secondary | ICD-10-CM | POA: Diagnosis not present

## 2016-04-19 DIAGNOSIS — I10 Essential (primary) hypertension: Secondary | ICD-10-CM

## 2016-04-19 DIAGNOSIS — E669 Obesity, unspecified: Secondary | ICD-10-CM

## 2016-04-19 LAB — COMPREHENSIVE METABOLIC PANEL
ALBUMIN: 4.1 g/dL (ref 3.6–5.1)
ALT: 19 U/L (ref 9–46)
AST: 21 U/L (ref 10–35)
Alkaline Phosphatase: 74 U/L (ref 40–115)
BUN: 25 mg/dL (ref 7–25)
CALCIUM: 9.2 mg/dL (ref 8.6–10.3)
CHLORIDE: 101 mmol/L (ref 98–110)
CO2: 27 mmol/L (ref 20–31)
CREATININE: 1.9 mg/dL — AB (ref 0.70–1.18)
Glucose, Bld: 103 mg/dL — ABNORMAL HIGH (ref 65–99)
POTASSIUM: 4 mmol/L (ref 3.5–5.3)
Sodium: 137 mmol/L (ref 135–146)
TOTAL PROTEIN: 7.5 g/dL (ref 6.1–8.1)
Total Bilirubin: 0.6 mg/dL (ref 0.2–1.2)

## 2016-04-19 LAB — POCT URINALYSIS DIPSTICK
Bilirubin, UA: NEGATIVE
Blood, UA: NEGATIVE
Glucose, UA: NEGATIVE
KETONES UA: NEGATIVE
LEUKOCYTES UA: NEGATIVE
NITRITE UA: NEGATIVE
PH UA: 6
PROTEIN UA: NEGATIVE
Spec Grav, UA: >=1.03
UROBILINOGEN UA: NEGATIVE

## 2016-04-19 LAB — CBC WITH DIFFERENTIAL/PLATELET
BASOS ABS: 77 {cells}/uL (ref 0–200)
Basophils Relative: 1 %
EOS ABS: 462 {cells}/uL (ref 15–500)
Eosinophils Relative: 6 %
HCT: 44.7 % (ref 38.5–50.0)
Hemoglobin: 14.7 g/dL (ref 13.2–17.1)
LYMPHS ABS: 2310 {cells}/uL (ref 850–3900)
Lymphocytes Relative: 30 %
MCH: 27.6 pg (ref 27.0–33.0)
MCHC: 32.9 g/dL (ref 32.0–36.0)
MCV: 83.9 fL (ref 80.0–100.0)
MPV: 8.9 fL (ref 7.5–12.5)
Monocytes Absolute: 693 cells/uL (ref 200–950)
Monocytes Relative: 9 %
NEUTROS ABS: 4158 {cells}/uL (ref 1500–7800)
Neutrophils Relative %: 54 %
Platelets: 214 10*3/uL (ref 140–400)
RBC: 5.33 MIL/uL (ref 4.20–5.80)
RDW: 15.1 % — ABNORMAL HIGH (ref 11.0–15.0)
WBC: 7.7 10*3/uL (ref 4.0–10.5)

## 2016-04-19 LAB — LIPID PANEL
CHOLESTEROL: 221 mg/dL — AB (ref <200)
HDL: 44 mg/dL (ref >40)
LDL Cholesterol: 145 mg/dL — ABNORMAL HIGH (ref <100)
TRIGLYCERIDES: 158 mg/dL — AB (ref <150)
Total CHOL/HDL Ratio: 5 Ratio — ABNORMAL HIGH (ref <5.0)
VLDL: 32 mg/dL — AB (ref <30)

## 2016-04-19 MED ORDER — LISINOPRIL-HYDROCHLOROTHIAZIDE 10-12.5 MG PO TABS: 1.0000 | ORAL_TABLET | Freq: Every day | ORAL | 3 refills | Status: DC

## 2016-04-19 NOTE — Patient Instructions (Signed)
Current Outpatient Prescriptions on File Prior to Visit  Medication Sig Dispense Refill  . glucosamine-chondroitin 500-400 MG tablet Take 1 tablet by mouth 3 (three) times daily.    . Multiple Vitamin (MULTIVITAMIN) capsule Take 1 capsule by mouth daily.     No current facility-administered medications on file prior to visit.

## 2016-04-19 NOTE — Progress Notes (Signed)
Subjective:   HPI  Stephen Padilla is a 71 y.o. male who presents for a complete physical.  Medical care team includes:     Preventative care: Last ophthalmology visit: 04/13/16 Last dental visit: 11/2015 Last colonoscopy:10/08/07 Last prostate exam: ? Last EKG: wnl Last labs:02/04/15  Prior vaccinations: TD or Tdap:09/04/07 Influenza: 01/16/16 Pneumococcal: 23:09/04/07 13:06/03/13 Shingles/Zostavax: 12/17/10 Advanced directive:No. Information given  Concerns: Intuniv on his blood pressure medication and is having no difficulty with that. His exercise is been quite minimal. He does not smoke. He continues to work part-time and very much enjoys this. His marriage is going well. They do plan on going on a trip next year to [place] and he plans to start an exercise program to get ready for this as he will be very active over there.   Reviewed their medical, surgical, family, social, medication, and allergy history and updated chart as appropriate.    Review of Systems Constitutional: -fever, -chills, -sweats, -unexpected weight change, -decreased appetite, -fatigue Allergy: -sneezing, -itching, -congestion Dermatology: -changing moles, --rash, -lumps ENT: -runny nose, -ear pain, -sore throat, -hoarseness, -sinus pain, -teeth pain, - ringing in ears, -hearing loss, -nosebleeds Cardiology: -chest pain, -palpitations, -swelling, -difficulty breathing when lying flat, -waking up short of breath Respiratory: -cough, -shortness of breath, -difficulty breathing with exercise or exertion, -wheezing, -coughing up blood Gastroenterology: -abdominal pain, -nausea, -vomiting, -diarrhea, -constipation, -blood in stool, -changes in bowel movement, -difficulty swallowing or eating Musculoskeletal: -joint aches, -muscle aches, -joint swelling, -back pain, -neck pain, -cramping, -changes in gait Ophthalmology: denies vision changes, eye redness, itching, discharge Psychology: -depressed mood,  -agitation, -sleep problems     Objective:   Physical Exam   General appearance: alert, no distress, WD/WN,  Skin:Normal HEENT: normocephalic, conjunctiva/corneas normal, sclerae anicteric, PERRLA, EOMi, nares patent, no discharge or erythema, pharynx normal Oral cavity: MMM, tongue normal, teeth normal Neck: supple, no lymphadenopathy, no thyromegaly, no masses, normal ROM Chest: non tender, normal shape and expansion Heart: RRR, normal S1, S2, no murmurs Lungs: CTA bilaterally, no wheezes, rhonchi, or rales Abdomen: +bs, soft, non tender, non distended, no masses, no hepatomegaly, no splenomegaly, no bruits  Musculoskeletal: upper extremities non tender, no obvious deformity, normal ROM throughout, lower extremities non tender, no obvious deformity, normal ROM throughout Extremities: no edema, no cyanosis, no clubbing Pulses: 2+ symmetric, upper and lower extremities, normal cap refill Neurological: alert, oriented x 3, CN2-12 intact, strength normal upper extremities and lower extremities, sensation normal throughout, DTRs 2+ throughout, no cerebellar signs, gait normal Psychiatric: normal affect, behavior normal, pleasant  EKG is normal Assessment and Plan :   Routine general medical examination at a health care facility - Plan: POCT Urinalysis Dipstick, EKG 12-Lead, CBC with Differential/Platelet, Comprehensive metabolic panel  Essential hypertension - Plan: lisinopril-hydrochlorothiazide (PRINZIDE,ZESTORETIC) 10-12.5 MG tablet, EKG 12-Lead  Hyperlipidemia with target LDL less than 100 - Plan: Lipid panel  Obesity (BMI 30-39.9) - Plan: EKG 12-Lead, CBC with Differential/Platelet, Comprehensive metabolic panel  Need for hepatitis C screening test - Plan: Hepatitis C antibody  So recommend he get the new shingles vaccine and he will need a tetanus shot next year. Recommend he get all these at the drugstore. Physical exam - discussed healthy lifestyle, diet, exercise,  preventative care, vaccinations, and addressed his concerns.   Strongly encouraged him to get involved in a diet and exercise program. Recommend cutting back on carbohydrates.  Follow-up one year.

## 2016-04-20 LAB — HEPATITIS C ANTIBODY: HCV Ab: NEGATIVE

## 2016-07-22 ENCOUNTER — Other Ambulatory Visit: Payer: Self-pay

## 2016-07-22 ENCOUNTER — Telehealth: Payer: Self-pay

## 2016-07-22 MED ORDER — ZOSTER VAC RECOMB ADJUVANTED 50 MCG/0.5ML IM SUSR: 0.5000 mL | Freq: Once | INTRAMUSCULAR | 1 refills | Status: AC

## 2016-07-22 NOTE — Telephone Encounter (Signed)
[facility] [address] Trixie Rude/RLB

## 2016-07-22 NOTE — Telephone Encounter (Signed)
ok 

## 2016-07-22 NOTE — Telephone Encounter (Signed)
Sent in rx through [facility]

## 2016-07-25 ENCOUNTER — Telehealth: Payer: Self-pay | Admitting: Family Medicine

## 2016-07-25 NOTE — Telephone Encounter (Signed)
[facility], pt needs Rx for Shingrix vial to be administered by pharmacist

## 2016-07-26 NOTE — Telephone Encounter (Signed)
Rx faxed to pharmacy  

## 2016-09-06 ENCOUNTER — Telehealth: Payer: Self-pay | Admitting: Family Medicine

## 2016-09-06 ENCOUNTER — Encounter (HOSPITAL_COMMUNITY): Payer: Self-pay | Admitting: Emergency Medicine

## 2016-09-06 ENCOUNTER — Emergency Department (HOSPITAL_COMMUNITY): Payer: Medicare Other

## 2016-09-06 ENCOUNTER — Emergency Department (HOSPITAL_COMMUNITY)
Admission: EM | Admit: 2016-09-06 | Discharge: 2016-09-06 | Disposition: A | Discharge status: Home / Self Care | Payer: Medicare Other | Attending: Emergency Medicine | Admitting: Emergency Medicine

## 2016-09-06 DIAGNOSIS — Z79899 Other long term (current) drug therapy: Secondary | ICD-10-CM | POA: Diagnosis not present

## 2016-09-06 DIAGNOSIS — R0602 Shortness of breath: Secondary | ICD-10-CM

## 2016-09-06 DIAGNOSIS — R42 Dizziness and giddiness: Secondary | ICD-10-CM

## 2016-09-06 DIAGNOSIS — Z7982 Long term (current) use of aspirin: Secondary | ICD-10-CM | POA: Diagnosis not present

## 2016-09-06 DIAGNOSIS — I10 Essential (primary) hypertension: Secondary | ICD-10-CM | POA: Insufficient documentation

## 2016-09-06 LAB — BASIC METABOLIC PANEL
ANION GAP: 7 (ref 5–15)
BUN: 25 mg/dL — ABNORMAL HIGH (ref 6–20)
CALCIUM: 9.5 mg/dL (ref 8.9–10.3)
CO2: 27 mmol/L (ref 22–32)
Chloride: 103 mmol/L (ref 101–111)
Creatinine, Ser: 1.76 mg/dL — ABNORMAL HIGH (ref 0.61–1.24)
GFR, EST AFRICAN AMERICAN: 42 mL/min — AB (ref >60)
GFR, EST NON AFRICAN AMERICAN: 37 mL/min — AB (ref >60)
GLUCOSE: 126 mg/dL — AB (ref 65–99)
POTASSIUM: 4.4 mmol/L (ref 3.5–5.1)
Sodium: 137 mmol/L (ref 135–145)

## 2016-09-06 LAB — CBC
HEMATOCRIT: 45.9 % (ref 39.0–52.0)
HEMOGLOBIN: 15.3 g/dL (ref 13.0–17.0)
MCH: 28.2 pg (ref 26.0–34.0)
MCHC: 33.3 g/dL (ref 30.0–36.0)
MCV: 84.5 fL (ref 78.0–100.0)
Platelets: 203 10*3/uL (ref 150–400)
RBC: 5.43 MIL/uL (ref 4.22–5.81)
RDW: 15 % (ref 11.5–15.5)
WBC: 8.1 10*3/uL (ref 4.0–10.5)

## 2016-09-06 LAB — URINALYSIS, ROUTINE W REFLEX MICROSCOPIC
BILIRUBIN URINE: NEGATIVE
Glucose, UA: NEGATIVE mg/dL
Hgb urine dipstick: NEGATIVE
Ketones, ur: NEGATIVE mg/dL
Leukocytes, UA: NEGATIVE
NITRITE: NEGATIVE
PH: 5 (ref 5.0–8.0)
Protein, ur: NEGATIVE mg/dL
SPECIFIC GRAVITY, URINE: 1.017 (ref 1.005–1.030)

## 2016-09-06 LAB — CBG MONITORING, ED: GLUCOSE-CAPILLARY: 130 mg/dL — AB (ref 65–99)

## 2016-09-06 LAB — I-STAT TROPONIN, ED: TROPONIN I, POC: 0.01 ng/mL (ref 0.00–0.08)

## 2016-09-06 MED ORDER — MECLIZINE HCL 25 MG PO TABS
25.0000 mg | ORAL_TABLET | Freq: Once | ORAL | Status: AC
  Administered 2016-09-06: 25 mg via ORAL
  Filled 2016-09-06: qty 1

## 2016-09-06 MED ORDER — MECLIZINE HCL 25 MG PO TABS: 25.0000 mg | ORAL_TABLET | Freq: Three times a day (TID) | ORAL | 0 refills | Status: DC | PRN

## 2016-09-06 NOTE — ED Notes (Signed)
Patient returned from MRI.

## 2016-09-06 NOTE — ED Notes (Signed)
Patient Alert and oriented X4. Stable and ambulatory. Patient verbalized understanding of the discharge instructions.  Patient belongings were taken by the patient.  

## 2016-09-06 NOTE — ED Triage Notes (Signed)
Pt. Stated, I woke up dizzy and clammy this morning and my BP was high. Pt still feels a little woozy.

## 2016-09-06 NOTE — ED Notes (Signed)
Pt. Had no SOB, no dizziness, no difficulty ambulating back and forth to restroom.  Pt. Stated "he feels fine and walking normally."

## 2016-09-06 NOTE — Telephone Encounter (Addendum)
Pt's wife called stating that pt woke up this morning getting dressed for work and his blood pressure starting going up, he started sweating and he got nauseated. He is weak, dizzy and wife is concerned that he is showing signs of a heart attak. Wife is a Engineer, civil (consulting)nurse. Dr Susann GivensLalonde advised that pt be taken to the ED. [facility]

## 2016-09-06 NOTE — ED Provider Notes (Signed)
[facility]-EMERGENCY DEPT Provider Note   CSN: [id number] Arrival date & time: 09/06/16  1051     History   Chief Complaint Chief Complaint  Patient presents with  . Dizziness    HPI Stephen Padilla is a 71 y.o. male.  Patient with hypercholesterolemia and hypertension presents with complaint of dizziness described as a spinning sensation. Symptoms started acutely last night when patient got up to get ready for bed. He was able to sleep but upon waking this morning he was very dizzy, nauseous, clammy. He did not have any associated chest pains or acute shortness of breath. (Does state he has had several months of decreased exercise tolerance, for instance when walking to the mailbox, no CP). No palpitations, lightheadedness or syncope. Patient denies signs of stroke including: facial droop, slurred speech, aphasia, weakness/numbness in extremities. Per family member, patient had trouble walking and had a hold onto things while he was walking. No treatments prior to arrival. Family called PCP who advised that patient come to the hospital. Symptoms are currently improved but not resolved. Patient denies any recent URI or viral syndromes. Patient denies having similar vertigo in the past. No recent short-lived episodes of dizziness or other focal neuro deficits. The onset of this condition was acute. Aggravating factors: movement of head. Alleviating factors: none.        Past Medical History:  Diagnosis Date  . Allergy    RHINITIS  . Alopecia   . Cataract   . Hemorrhoids   . Hypercholesterolemia   . Hypertension   . Obesity     Patient Active Problem List   Diagnosis Date Noted  . Hypertension 12/10/2010  . Hyperlipidemia with target LDL less than 100 12/10/2010  . Obesity (BMI 30-39.9) 12/10/2010    Past Surgical History:  Procedure Laterality Date  . EYE SURGERY  2011   BILAT CATARACTS       Home Medications    Prior to Admission medications   Medication Sig Start  Date End Date Taking? Authorizing Provider  aspirin EC 81 MG tablet Take 81 mg by mouth daily.    [provider]  glucosamine-chondroitin 500-400 MG tablet Take 1 tablet by mouth 3 (three) times daily.    [provider]  lisinopril-hydrochlorothiazide (PRINZIDE,ZESTORETIC) 10-12.5 MG tablet Take 1 tablet by mouth daily. 04/19/16   Ronnald Nian, MD  Multiple Vitamin (MULTIVITAMIN) capsule Take 1 capsule by mouth daily.    [provider]    Family History Family History  Problem Relation Age of Onset  . Hypertension Mother   . Heart disease Sister     Social History Social History  Substance Use Topics  . Smoking status: Never Smoker  . Smokeless tobacco: Never Used  . Alcohol use 0.0 oz/week     Comment: occasionally, maybe 1-2 drinks socially (usually once a month)     Allergies   Penicillins   Review of Systems Review of Systems  Constitutional: Negative for fever.  HENT: Negative for congestion, dental problem, rhinorrhea, sinus pressure and sore throat.   Eyes: Negative for photophobia, discharge, redness and visual disturbance.  Respiratory: Positive for shortness of breath (ongoing). Negative for cough.   Cardiovascular: Negative for chest pain.  Gastrointestinal: Negative for abdominal pain, diarrhea, nausea and vomiting.  Genitourinary: Negative for dysuria.  Musculoskeletal: Negative for gait problem, myalgias, neck pain and neck stiffness.  Skin: Negative for rash.  Neurological: Positive for dizziness. Negative for syncope, speech difficulty, weakness, light-headedness, numbness and headaches.  Psychiatric/Behavioral: Negative for confusion.     Physical Exam Updated Vital Signs BP 113/81   Pulse 81   Temp 98 F (36.7 C) (Oral)   Resp 14   Ht 5\' 9"  (1.753 m)   Wt 104.3 kg (230 lb)   SpO2 97%   BMI 33.97 kg/m   Physical Exam  Constitutional: He is oriented to person, place, and time. He appears well-developed and  well-nourished.  HENT:  Head: Normocephalic and atraumatic.  Right Ear: Tympanic membrane, external ear and ear canal normal.  Left Ear: Tympanic membrane, external ear and ear canal normal.  Nose: Nose normal.  Mouth/Throat: Uvula is midline, oropharynx is clear and moist and mucous membranes are normal.  Cerumen noted bilateral ear canals  Eyes: Pupils are equal, round, and reactive to light. Conjunctivae, EOM and lids are normal. Right eye exhibits no discharge. Left eye exhibits no discharge.  Nystagmus, fast phase towards right  Neck: Normal range of motion. Neck supple.  Cardiovascular: Normal rate, regular rhythm and normal heart sounds.   Pulmonary/Chest: Effort normal and breath sounds normal. No respiratory distress. He has no wheezes. He has no rales.  Abdominal: Soft. There is no tenderness.  Musculoskeletal: Normal range of motion.       Cervical back: He exhibits normal range of motion, no tenderness and no bony tenderness.  Neurological: He is alert and oriented to person, place, and time. He has normal strength and normal reflexes. No cranial nerve deficit or sensory deficit. He exhibits normal muscle tone. He displays a negative Romberg sign. Coordination and gait normal. GCS eye subscore is 4. GCS verbal subscore is 5. GCS motor subscore is 6.  Skin: Skin is warm and dry.  Psychiatric: He has a normal mood and affect.  Nursing note and vitals reviewed.    ED Treatments / Results  Labs (all labs ordered are listed, but only abnormal results are displayed) Labs Reviewed  BASIC METABOLIC PANEL - Abnormal; Notable for the following:       Result Value   Glucose, Bld 126 (*)    BUN 25 (*)    Creatinine, Ser 1.76 (*)    GFR calc non Af Amer 37 (*)    GFR calc Af Amer 42 (*)    All other components within normal limits  CBC  URINALYSIS, ROUTINE W REFLEX MICROSCOPIC  I-STAT TROPONIN, ED  CBG MONITORING, ED    EKG  EKG Interpretation  Date/Time:  Tuesday September 06 2016 10:59:20 EDT Ventricular Rate:  73 PR Interval:  160 QRS Duration: 90 QT Interval:  386 QTC Calculation: 425 R Axis:   71 Text Interpretation:  Normal sinus rhythm Normal ECG No significant change since last tracing Confirmed by Shaune PollackIsaacs, Cameron [phone]([id number]) on 09/06/2016 2:34:07 PM       Radiology No results found.  Procedures Procedures (including critical care time)  Medications Ordered in ED Medications  meclizine (ANTIVERT) tablet 25 mg (25 mg Oral Given 09/06/16 1338)     Initial Impression / Assessment and Plan / ED Course  I have reviewed the triage vital signs and the nursing notes.  Pertinent labs & imaging results that were available during my care of the patient were reviewed by me and considered in my medical decision making (see chart for details).     Patient seen and examined. Work-up initiated. Medications ordered.   Vital signs reviewed and are as follows: BP 113/81   Pulse 81   Temp 98 F (36.7 C) (Oral)  Resp 14   Ht 5\' 9"  (1.753 m)   Wt 104.3 kg (230 lb)   SpO2 97%   BMI 33.97 kg/m   Patient seen and examined with Dr. Erma Heritage. Reviewed risk factor profile. Patient with HTN, high cholesterol. Borderline DM.    3:24 PM Handoff to [facility] at shift change.   Final Clinical Impressions(s) / ED Diagnoses   Final diagnoses:  Vertigo   Pending completion of MRI to r/o posterior stroke. If neg, can go home with meclizine and close PCP f/u. He will need to f/u to discuss exertional SOB that has been ongoing over the past several months.    New Prescriptions New Prescriptions   No medications on file     Renne Crigler, Cordelia Poche 09/06/16 1526    Shaune Pollack, MD 09/06/16 [phone]

## 2016-09-06 NOTE — ED Provider Notes (Signed)
I assumed care of this patient from Renne CriglerJoshua Geiple PA-C at shift change (1600).  Briefly patient is a 71 year old male who presents with dizziness.  His workup included a BMP, CBC, troponin, EKG, and cardiac monitoring, all of which did not reveal a cause for his dizziness. At the time of shift change patient has MRI brain pending to evaluate for possible posterior stroke or other intracranial causes of his dizziness.  If MRI is normal, then tentative plan is to discharge with meclizine and follow-up with PCP.  MRI was obtained and reviewed. No findings on MRI to explain patient's symptoms today.  EXAM:  MRI HEAD WITHOUT CONTRAST    TECHNIQUE:  Multiplanar, multiecho pulse sequences of the brain and surrounding  structures were obtained without intravenous contrast.    COMPARISON: None available.    FINDINGS:  Brain: Generalized age related cerebral volume loss. No significant  cerebral white matter disease for age. No abnormal foci of  restricted diffusion to suggest acute or subacute ischemia. No  evidence for chronic infarction. No evidence for acute or chronic  intracranial hemorrhage.    No mass lesion, midline shift or mass effect. No hydrocephalus. No  extra-axial fluid collection. Major dural sinuses are grossly  patent.    Pituitary gland suprasellar region within normal limits.    Vascular: Major intracranial vascular flow voids are maintained.    Skull and upper cervical spine: Craniocervical junction normal.  Visualized upper cervical spine unremarkable. Bone marrow signal  intensity within normal limits. No scalp soft tissue abnormality.    Sinuses/Orbits: Globes normal soft tissues within normal limits.  Patient status post lens extraction bilaterally. Mild scattered  mucosal thickening within the ethmoidal air cells. Paranasal sinuses  are otherwise clear. No mastoid effusion. Inner ear structures  normal.    IMPRESSION:  Normal brain MRI for patient  age. No acute intracranial process  identified.      Electronically Signed  By: Rise MuBenjamin McClintock M.D.  On: 09/06/2016 19:57    I informed patient of his MRI results, and his plan for discharge with meclizine Rx and to follow-up with his primary care doctor. At time of discharge patient is resting comfortably, in no obvious distress, says that he feels better and is ready to go home. No obvious facial droop or nystagmus this time of my exam. Patient's respirations are even and unlabored, symmetrical bilaterally. He is awake and alert, in no distress. Patient was given the option to ask questions, all of which were answered to the best of my abilities.  Vitals reviewed and patient is hemodynamically stable for discharge at this time with appropriate outpatient follow up.     At this time there does not appear to be any evidence of an acute emergency medical condition and the patient appears stable for discharge with appropriate outpatient follow up.Diagnosis was discussed with patient who verbalizes understanding and is agreeable to discharge.      Norman ClayHammond, Elizabeth W, PA-C 09/06/16 2036    Doug SouJacubowitz, Sam, MD 09/07/16 [phone]

## 2016-09-06 NOTE — ED Notes (Signed)
Patient transported to MRI 

## 2016-09-06 NOTE — ED Notes (Signed)
ED Provider at bedside. 

## 2016-09-06 NOTE — Discharge Instructions (Signed)
Please read and follow all provided instructions.  Your diagnoses today include:  1. Vertigo   2. Exertional shortness of breath     Tests performed today include:  Blood counts and electrolytes  EKG - no sign of heart attack  Blood test for heart muscle damage - no sign of heart attack  MRI  Vital signs. See below for your results today.   Medications prescribed:   Meclizine - medication for dizziness  Take any prescribed medications only as directed.  Home care instructions:  Follow any educational materials contained in this packet.  BE VERY CAREFUL not to take multiple medicines containing Tylenol (also called acetaminophen). Doing so can lead to an overdose which can damage your liver and cause liver failure and possibly death.   Follow-up instructions: Please follow-up with your primary care provider in the next 3 days for further evaluation of your symptoms.   Return instructions:   Please return to the Emergency Department if you experience worsening symptoms.   Return if you have weakness in your arms or legs, slurred speech, trouble walking or talking, confusion, or trouble with your balance.   Return with chest pain or persistent shortness of breath.  Please return if you have any other emergent concerns.  Additional Information:  Your vital signs today were: BP 111/76    Pulse 65    Temp 98 F (36.7 C) (Oral)    Resp 16    Ht 5\' 9"  (1.753 m)    Wt 104.3 kg (230 lb)    SpO2 96%    BMI 33.97 kg/m  If your blood pressure (BP) was elevated above 135/85 this visit, please have this repeated by your doctor within one month. --------------

## 2016-09-12 ENCOUNTER — Encounter: Payer: Self-pay | Admitting: Family Medicine

## 2016-09-12 ENCOUNTER — Ambulatory Visit (INDEPENDENT_AMBULATORY_CARE_PROVIDER_SITE_OTHER): Payer: Medicare Other | Admitting: Family Medicine

## 2016-09-12 VITALS — BP 100/70 | HR 70 | Wt 233.8 lb

## 2016-09-12 DIAGNOSIS — I10 Essential (primary) hypertension: Secondary | ICD-10-CM

## 2016-09-12 DIAGNOSIS — R42 Dizziness and giddiness: Secondary | ICD-10-CM

## 2016-09-12 NOTE — Progress Notes (Signed)
   Subjective:    Patient ID: Stephen Padilla, male    DOB: 12/10/1945, 71 y.o.   MRN: [id number]  HPI He is here for recheck after recent visit to the emergency room. He apparently woke up and was quite dizzy and diaphoretic. He was sent to the emergency room. The gave him a good workup including blood work for cardiac issues as well as an MRI. The emergency room record including lab and x-rays was reviewed. Presently he is having no dizziness, weakness, numbness, tingling, headache, blurred or double vision. He states that he cannot relate the dizziness to any particular head position.   Review of Systems     Objective:   Physical Exam Alert and in no distress. EOMI. Cerebellar testing negative. Normal finger to nose. DTRs normal. Tympanic membranes and canals are normal. Pharyngeal area is normal. Neck is supple without adenopathy or thyromegaly. Cardiac exam shows a regular sinus rhythm without murmurs or gallops. Lungs are clear to auscultation.        Assessment & Plan:  Dizziness  Essential hypertension I explained that I did not think it was from his blood pressure and it looks as if his cardiac as well as CNS function is normal. Discussed the possible use of meclizine to help with that. Strongly encouraged him to come more physically active for general health purposes. Over 25 minutes, greater than 50% spent in counseling and coordination of care.

## 2017-01-26 ENCOUNTER — Other Ambulatory Visit (INDEPENDENT_AMBULATORY_CARE_PROVIDER_SITE_OTHER): Payer: Medicare Other

## 2017-01-26 DIAGNOSIS — Z23 Encounter for immunization: Secondary | ICD-10-CM

## 2017-05-04 ENCOUNTER — Encounter: Payer: Self-pay | Admitting: Family Medicine

## 2017-05-04 ENCOUNTER — Ambulatory Visit: Payer: Medicare Other | Admitting: Family Medicine

## 2017-05-04 VITALS — BP 128/76 | HR 77 | Temp 97.7°F | Wt 231.8 lb

## 2017-05-04 DIAGNOSIS — I10 Essential (primary) hypertension: Secondary | ICD-10-CM

## 2017-05-04 DIAGNOSIS — Z0000 Encounter for general adult medical examination without abnormal findings: Secondary | ICD-10-CM | POA: Diagnosis not present

## 2017-05-04 DIAGNOSIS — E785 Hyperlipidemia, unspecified: Secondary | ICD-10-CM | POA: Diagnosis not present

## 2017-05-04 DIAGNOSIS — Z1211 Encounter for screening for malignant neoplasm of colon: Secondary | ICD-10-CM | POA: Diagnosis not present

## 2017-05-04 DIAGNOSIS — E669 Obesity, unspecified: Secondary | ICD-10-CM

## 2017-05-04 MED ORDER — LISINOPRIL-HYDROCHLOROTHIAZIDE 10-12.5 MG PO TABS: 1.0000 | ORAL_TABLET | Freq: Every day | ORAL | 3 refills | Status: DC

## 2017-05-04 NOTE — Progress Notes (Signed)
Stephen Padilla is a 72 y.o. male who presents for annual wellness visit ,CPE and follow-up on chronic medical conditions.  He has no particular concerns or complaints.  He continues on lisinopril/HCTZ without difficulty.  He also takes aspirin as well as glucosamine chondroitin and multivitamins.  He is retired and doing well with this.  His marriage is going well.  He has had some difficulty with URI symptoms but at this point seems to be getting better from that. Family and social history reviewed.  Health maintenance listed below as well as immunizations  Immunizations and Health Maintenance Immunization History  Administered Date(s) Administered  . Influenza Split 12/10/2010, 01/23/2012  . Influenza, High Dose Seasonal PF 02/04/2015, 01/26/2017  . Influenza-Unspecified 01/16/2016  . PPD Test 01/04/1980  . Pneumococcal Conjugate-13 06/03/2013  . Pneumococcal-Unspecified 09/04/2007  . Tdap 09/04/2007  . Zoster 12/17/2010   Health Maintenance Due  Topic Date Due  . PNA vac Low Risk Adult (2 of 2 - PPSV23) 06/04/2014    Last colonoscopy:about 10 years ago Last PSA: last year Dentist: jan 2019 Ophtho:last year Exercise:walking for five days a week  Other doctors caring for patient include:none  Advanced Directives: Yes.  Copy asked for.    Depression screen:  See questionnaire below.     Depression screen [facility] 2/9 05/04/2017 04/19/2016 02/04/2015  Decreased Interest - 0 0  Down, Depressed, Hopeless 0 0 0  PHQ - 2 Score 0 0 0    Fall Screen: See Questionaire below.   Fall Risk  05/04/2017 04/19/2016 02/04/2015  Falls in the past year? No No No    ADL screen:  See questionnaire below.  Functional Status Survey: Is the patient deaf or have difficulty hearing?: No Does the patient have difficulty seeing, even when wearing glasses/contacts?: No Does the patient have difficulty concentrating, remembering, or making decisions?: No Does the patient have difficulty walking or  climbing stairs?: No Does the patient have difficulty dressing or bathing?: No Does the patient have difficulty doing errands alone such as visiting a doctor's office or shopping?: No   Review of Systems  Constitutional: -, -unexpected weight change, -anorexia, -fatigue Allergy: -sneezing, -itching, -congestion Dermatology: denies changing moles, rash, lumps ENT: -runny nose, -ear pain, -sore throat,  Cardiology:  -chest pain, -palpitations, -orthopnea, Respiratory: -cough, -shortness of breath, -dyspnea on exertion, -wheezing,  Gastroenterology: -abdominal pain, -nausea, -vomiting, -diarrhea, -constipation, -dysphagia Hematology: -bleeding or bruising problems Musculoskeletal: -arthralgias, -myalgias, -joint swelling, -back pain, - Ophthalmology: -vision changes,  Urology: -dysuria, -difficulty urinating,  -urinary frequency, -urgency, incontinence Neurology: -, -numbness, , -memory loss, -falls, -dizziness    PHYSICAL EXAM:  There were no vitals taken for this visit.  General Appearance: Alert, cooperative, no distress, appears stated age Head: Normocephalic, without obvious abnormality, atraumatic Eyes: PERRL, conjunctiva/corneas clear, EOM's intact, fundi benign Ears: Normal TM's and external ear canals Nose: Nares normal, mucosa normal, no drainage or sinus   tenderness Throat: Lips, mucosa, and tongue normal; teeth and gums normal Neck: Supple, no lymphadenopathy, thyroid:no enlargement/tenderness/nodules; no carotid bruit or JVD Lungs: Clear to auscultation bilaterally without wheezes, rales or ronchi; respirations unlabored Heart: Regular rate and rhythm, S1 and S2 normal, no murmur, rub or gallop Abdomen: Soft, non-tender, nondistended, normoactive bowel sounds, no masses, no hepatosplenomegaly Extremities: No clubbing, cyanosis or edema Pulses: 2+ and symmetric all extremities Skin: Skin color, texture, turgor normal, no rashes or lesions Lymph nodes: Cervical,  supraclavicular, and axillary nodes normal Neurologic: CNII-XII intact, normal strength, sensation and gait; reflexes  2+ and symmetric throughout   Psych: Normal mood, affect, hygiene and grooming Genitalia shows normal uncircumcised male with normal penis and testes ASSESSMENT/PLAN: Routine general medical examination at a health care facility  Screening for colon cancer - Plan: Cologuard  Hyperlipidemia with target LDL less than 100 - Plan: Lipid panel  Essential hypertension - Plan: lisinopril-hydrochlorothiazide (PRINZIDE,ZESTORETIC) 10-12.5 MG tablet  Obesity (BMI 30-39.9) Discussed the need for him to get a Tdap as well as Shingrix.  Encouraged him to get involved in a regular exercise program as well as making dietary changes.  Blood pressure medication renewed.     Medicare Attestation I have personally reviewed: The patient's medical and social history Their use of alcohol, tobacco or illicit drugs Their current medications and supplements The patient's functional ability including ADLs,fall risks, home safety risks, cognitive, and hearing and visual impairment Diet and physical activities Evidence for depression or mood disorders  The patient's weight, height, and BMI have been recorded in the chart.  I have made referrals, counseling, and provided education to the patient based on review of the above and I have provided the patient with a written personalized care plan for preventive services.     Sharlot GowdaJohn Lalonde, MD   05/04/2017

## 2017-05-04 NOTE — Patient Instructions (Signed)
  [name] , Thank you for taking time to come for your Medicare Wellness Visit. I appreciate your ongoing commitment to your health goals. Please review the following plan we discussed and let me know if I can assist you in the future.   These are the goals we discussed: Goals    None      This is a list of the screening recommended for you and due dates:  Health Maintenance  Topic Date Due  . Pneumonia vaccines (2 of 2 - PPSV23) 06/04/2014  . Tetanus Vaccine  09/03/2017  . Colon Cancer Screening  10/07/2017  . Flu Shot  Completed  .  Hepatitis C: One time screening is recommended by Center for Disease Control  (CDC) for  adults born from [birth year] through 1965.   Completed

## 2017-05-05 LAB — LIPID PANEL
CHOL/HDL RATIO: 4.9 ratio (ref 0.0–5.0)
Cholesterol, Total: 230 mg/dL — ABNORMAL HIGH (ref 100–199)
HDL: 47 mg/dL (ref >39)
LDL Calculated: 154 mg/dL — ABNORMAL HIGH (ref 0–99)
Triglycerides: 147 mg/dL (ref 0–149)
VLDL CHOLESTEROL CAL: 29 mg/dL (ref 5–40)

## 2017-05-16 LAB — COLOGUARD: Cologuard: POSITIVE

## 2017-05-26 ENCOUNTER — Other Ambulatory Visit: Payer: Self-pay

## 2017-05-26 DIAGNOSIS — R195 Other fecal abnormalities: Secondary | ICD-10-CM

## 2017-07-19 ENCOUNTER — Encounter: Payer: Self-pay | Admitting: Gastroenterology

## 2017-07-28 ENCOUNTER — Encounter: Payer: Self-pay | Admitting: Gastroenterology

## 2017-09-06 ENCOUNTER — Encounter: Payer: Self-pay | Admitting: *Deleted

## 2017-09-28 ENCOUNTER — Ambulatory Visit (AMBULATORY_SURGERY_CENTER): Payer: Self-pay

## 2017-09-28 ENCOUNTER — Encounter: Payer: Self-pay | Admitting: Gastroenterology

## 2017-09-28 VITALS — Ht 69.0 in | Wt 231.6 lb

## 2017-09-28 DIAGNOSIS — R195 Other fecal abnormalities: Secondary | ICD-10-CM

## 2017-09-28 MED ORDER — NA SULFATE-K SULFATE-MG SULF 17.5-3.13-1.6 GM/177ML PO SOLN: 1.0000 | Freq: Once | ORAL | 0 refills | Status: AC

## 2017-09-28 NOTE — Progress Notes (Signed)
Denies allergies to eggs or soy products. Denies complication of anesthesia or sedation. Denies use of weight loss medication. Denies use of O2.   Emmi instructions declined.  

## 2017-10-02 ENCOUNTER — Telehealth: Payer: Self-pay | Admitting: Gastroenterology

## 2017-10-02 MED ORDER — NA SULFATE-K SULFATE-MG SULF 17.5-3.13-1.6 GM/177ML PO SOLN: 1.0000 | Freq: Once | ORAL | 0 refills | Status: AC

## 2017-10-02 NOTE — Telephone Encounter (Signed)
Spoke with pt. He states he needs the Sup[facility] [place]. Will send  another Rx for Suprep to the pharmacy. The pt will call back if he has further questions. Cherylann RatelJanis

## 2017-10-12 ENCOUNTER — Encounter: Payer: Self-pay | Admitting: Gastroenterology

## 2017-10-12 ENCOUNTER — Ambulatory Visit (AMBULATORY_SURGERY_CENTER): Payer: Medicare Other | Admitting: Gastroenterology

## 2017-10-12 VITALS — BP 103/66 | HR 68 | Temp 97.3°F | Resp 18 | Ht 69.0 in | Wt 231.0 lb

## 2017-10-12 DIAGNOSIS — R195 Other fecal abnormalities: Secondary | ICD-10-CM | POA: Diagnosis present

## 2017-10-12 MED ORDER — SODIUM CHLORIDE 0.9 % IV SOLN: 500.0000 mL | Freq: Once | INTRAVENOUS | Status: DC

## 2017-10-12 NOTE — Progress Notes (Signed)
Pt's states no medical or surgical changes since previsit or office visit. 

## 2017-10-12 NOTE — Progress Notes (Signed)
To PACU   Pt awake and alert report toRN 

## 2017-10-12 NOTE — Op Note (Signed)
Stephen Padilla Patient Name: Stephen Padilla Procedure Date: 10/12/2017 9:11 AM MRN: [id number] Endoscopist: Viviann Spare P. Adela Lank , MD Age: 72 Referring MD:  Date of Birth: August 28, 1945 Gender: Male Account #: [id number] Procedure:                Colonoscopy Indications:              Positive Cologuard test Medicines:                Monitored Anesthesia Care Procedure:                Pre-Anesthesia Assessment:                           - Prior to the procedure, a History and Physical                            was performed, and patient medications and                            allergies were reviewed. The patient's tolerance of                            previous anesthesia was also reviewed. The risks                            and benefits of the procedure and the sedation                            options and risks were discussed with the patient.                            All questions were answered, and informed consent                            was obtained. Prior Anticoagulants: The patient has                            taken no previous anticoagulant or antiplatelet                            agents. ASA Grade Assessment: II - A patient with                            mild systemic disease. After reviewing the risks                            and benefits, the patient was deemed in                            satisfactory condition to undergo the procedure.                           After obtaining informed consent, the colonoscope  was passed under direct vision. Throughout the                            procedure, the patient's blood pressure, pulse, and                            oxygen saturations were monitored continuously. The                            Model PCF-H190DL [id number]) scope was introduced                            through the anus and advanced to the the terminal                            ileum, with identification of  the appendiceal                            orifice and IC valve. The colonoscopy was performed                            without difficulty. The patient tolerated the                            procedure well. The quality of the bowel                            preparation was good. The terminal ileum, ileocecal                            valve, appendiceal orifice, and rectum were                            photographed. Scope In: 9:16:03 AM Scope Out: 9:32:07 AM Scope Withdrawal Time: 0 hours 13 minutes 53 seconds  Total Procedure Duration: 0 hours 16 minutes 4 seconds  Findings:                 The perianal and digital rectal examinations were                            normal.                           The terminal ileum appeared normal.                           Many medium-mouthed diverticula were found in the                            entire colon.                           Internal hemorrhoids were found during retroflexion.  The exam was otherwise without abnormality. No                            polyps or mass lesions made after careful exam and                            multiple passes. Complications:            No immediate complications. Estimated blood loss:                            None. Estimated Blood Loss:     Estimated blood loss: none. Impression:               - The examined portion of the ileum was normal.                           - Diverticulosis in the entire examined colon.                           - Internal hemorrhoids.                           - The examination was otherwise normal.                           - No polyps or mass lesions, likely false positive                            cologuard Recommendation:           - Patient has a contact number available for                            emergencies. The signs and symptoms of potential                            delayed complications were discussed with the                             patient. Return to normal activities tomorrow.                            Written discharge instructions were provided to the                            patient.                           - Resume previous diet.                           - Continue present medications.                           - Recent data endorses no need for additional  testing in the setting of positive Cologuard and                            negative colonoscopy. In this light, no further                            colon cancer screening is likely warranted given                            the patients age. Viviann Spare P. Armbruster, MD 10/12/2017 9:36:52 AM This report has been signed electronically.

## 2017-10-12 NOTE — Patient Instructions (Signed)
YOU HAD AN ENDOSCOPIC PROCEDURE TODAY AT THE [facility]:   Refer to the procedure report that was given to you for any specific questions about what was found during the examination.  If the procedure report does not answer your questions, please call your gastroenterologist to clarify.  If you requested that your care partner not be given the details of your procedure findings, then the procedure report has been included in a sealed envelope for you to review at your convenience later.  YOU SHOULD EXPECT: Some feelings of bloating in the abdomen. Passage of more gas than usual.  Walking can help get rid of the air that was put into your GI tract during the procedure and reduce the bloating. If you had a lower endoscopy (such as a colonoscopy or flexible sigmoidoscopy) you may notice spotting of blood in your stool or on the toilet paper. If you underwent a bowel prep for your procedure, you may not have a normal bowel movement for a few days.  Please Note:  You might notice some irritation and congestion in your nose or some drainage.  This is from the oxygen used during your procedure.  There is no need for concern and it should clear up in a day or so.  SYMPTOMS TO REPORT IMMEDIATELY:   Following lower endoscopy (colonoscopy or flexible sigmoidoscopy):  Excessive amounts of blood in the stool  Significant tenderness or worsening of abdominal pains  Swelling of the abdomen that is new, acute  Fever of 100F or higher  For urgent or emergent issues, a gastroenterologist can be reached at any hour by calling [phone].   DIET:  We do recommend a small meal at first, but then you may proceed to your regular diet.  Drink plenty of fluids but you should avoid alcoholic beverages for 24 hours.  ACTIVITY:  You should plan to take it easy for the rest of today and you should NOT DRIVE or use heavy machinery until tomorrow (because of the sedation medicines used during the test).     FOLLOW UP: Our staff will call the number listed on your records the next business day following your procedure to check on you and address any questions or concerns that you may have regarding the information given to you following your procedure. If we do not reach you, we will leave a message.  However, if you are feeling well and you are not experiencing any problems, there is no need to return our call.  We will assume that you have returned to your regular daily activities without incident.  If any biopsies were taken you will be contacted by phone or by letter within the next 1-3 weeks.  Please call us at [phone] if you have not heard about the biopsies in 3 weeks.    SIGNATURES/CONFIDENTIALITY: You and/or your care partner have signed paperwork which will be entered into your electronic medical record.  These signatures attest to the fact that that the information above on your After Visit Summary has been reviewed and is understood.  Full responsibility of the confidentiality of this discharge information lies with you and/or your care-partner. 

## 2017-10-17 ENCOUNTER — Telehealth: Payer: Self-pay | Admitting: *Deleted

## 2017-10-17 NOTE — Telephone Encounter (Signed)
  Follow up Call-  Call back number 10/12/2017  Post procedure Call Back phone  # [phone]  Permission to leave phone message Yes  Some recent data might be hidden     Patient questions:  Do you have a fever, pain , or abdominal swelling? No. Pain Score  0 *  Have you tolerated food without any problems? Yes.    Have you been able to return to your normal activities? Yes.    Do you have any questions about your discharge instructions: Diet   No. Medications  No. Follow up visit  No.  Do you have questions or concerns about your Care? No.  Actions: * If pain score is 4 or above: No action needed, pain <4.

## 2017-10-17 NOTE — Telephone Encounter (Signed)
First follow up call attempt.  Voicemail with name identifier.  Message left to call if any questions or concerns. 

## 2018-05-07 ENCOUNTER — Ambulatory Visit: Payer: Medicare Other | Admitting: Family Medicine

## 2018-06-07 ENCOUNTER — Telehealth: Payer: Self-pay

## 2018-06-07 NOTE — Telephone Encounter (Signed)
lvm for pt to download Zoom for Tuesday appt. KH

## 2018-06-12 ENCOUNTER — Encounter: Payer: Self-pay | Admitting: Family Medicine

## 2018-06-12 ENCOUNTER — Other Ambulatory Visit: Payer: Self-pay

## 2018-06-12 ENCOUNTER — Ambulatory Visit: Payer: Medicare Other | Admitting: Family Medicine

## 2018-06-12 VITALS — Wt 227.0 lb

## 2018-06-12 DIAGNOSIS — Z0000 Encounter for general adult medical examination without abnormal findings: Secondary | ICD-10-CM

## 2018-06-12 DIAGNOSIS — I10 Essential (primary) hypertension: Secondary | ICD-10-CM

## 2018-06-12 DIAGNOSIS — E785 Hyperlipidemia, unspecified: Secondary | ICD-10-CM | POA: Diagnosis not present

## 2018-06-12 DIAGNOSIS — R21 Rash and other nonspecific skin eruption: Secondary | ICD-10-CM | POA: Diagnosis not present

## 2018-06-12 DIAGNOSIS — E669 Obesity, unspecified: Secondary | ICD-10-CM

## 2018-06-12 MED ORDER — LISINOPRIL-HYDROCHLOROTHIAZIDE 10-12.5 MG PO TABS: 1.0000 | ORAL_TABLET | Freq: Every day | ORAL | 3 refills | Status: DC

## 2018-06-12 NOTE — Patient Instructions (Signed)
  Stephen Padilla , Thank you for taking time to come for your Medicare Wellness Visit. I appreciate your ongoing commitment to your health goals. Please review the following plan we discussed and let me know if I can assist you in the future.   These are the goals we discussed: Continue on present medications, stay active and use triamcinolone twice per day for the next 2 or 3 weeks. This is a list of the screening recommended for you and due dates:  Health Maintenance  Topic Date Due  . Flu Shot  09/15/2018  . Colon Cancer Screening  10/13/2027  . Tetanus Vaccine  12/19/2027  .  Hepatitis C: One time screening is recommended by Center for Disease Control  (CDC) for  adults born from [birth year] through 1967.   Completed  . Pneumonia vaccines  Discontinued

## 2018-06-12 NOTE — Progress Notes (Signed)
Elba BarmanDwight E Rhoda is a 73 y.o. male who presents for annual wellness visit,CPE and follow-up on chronic medical conditions.  Documentation for virtual telephone encounter. Documentation for virtual audio and video telecommunications through [employer] encounter:  The patient was located at home. The provider was located in the office. The patient did consent to this visit and is aware of possible charges through their insurance for this visit.  The other persons participating in this telemedicine service were none. Time spent on call was 5 minutes and in review of previous records >30 minutes total. This virtual service is not related to other E/M service within previous 7 days. He does have a rash present on his right forearm that he has been using triamcinolone on which did help however it keeps reoccurring.  It does not itch, cause pain or have any drainage. He continues on his lisinopril/HCTZ and is having no difficulty with that otherwise he continues on his OTC medications. Life in general is going very well for him.  He still has a part-time job but is about to give up the contract on that.  His home life is going quite well.  The COVID problem has interfered with travel. Social and family history as well as health maintenance and immunizations this to below were evaluated.     Immunizations and Health Maintenance Immunization History  Administered Date(s) Administered  . Influenza Split 12/10/2010, 01/23/2012  . Influenza, High Dose Seasonal PF 02/04/2015, 01/26/2017  . Influenza-Unspecified 01/16/2016, 12/18/2017  . PPD Test 01/04/1980  . Pneumococcal Conjugate-13 06/03/2013  . Pneumococcal-Unspecified 09/04/2007  . Td 12/18/2017  . Tdap 09/04/2007  . Zoster 12/17/2010  . Zoster Recombinat (Shingrix) 01/31/2018   There are no preventive care reminders to display for this patient.  Last colonoscopy: 10-12-17 Last PSA: 01-23-12 Dentist: Q six months Ophtho: 06/2017 Exercise:  not at this time, yard work  Other doctors caring for patient include: Dr. Adela LankArmbruster [facility]  Advanced Directives:yes copy asked for Does Patient Have a Medical Advance Directive?: Yes Type of Advance Directive: Healthcare Power of Attorney, Living will Does patient want to make changes to medical advance directive?: No - Patient declined Copy of Healthcare Power of Attorney in Chart?: No - copy requested  Depression screen:  See questionnaire below.     [facility] 06/12/2018 05/04/2017 04/19/2016 02/04/2015  Decreased Interest 0 - 0 0  Down, Depressed, Hopeless 0 0 0 0  PHQ - 2 Score 0 0 0 0    Fall Screen: See Questionaire below.   Fall Risk  06/12/2018 05/04/2017 04/19/2016 02/04/2015  Falls in the past year? 0 No No No    ADL screen:  See questionnaire below.  Functional Status Survey: Is the patient deaf or have difficulty hearing?: No Does the patient have difficulty seeing, even when wearing glasses/contacts?: No Does the patient have difficulty concentrating, remembering, or making decisions?: No Does the patient have difficulty walking or climbing stairs?: No Does the patient have difficulty dressing or bathing?: No Does the patient have difficulty doing errands alone such as visiting a doctor's office or shopping?: No   Review of Systems  Constitutional: -, -unexpected weight change, -anorexia, -fatigue Allergy: -sneezing, -itching, -congestion Dermatology: denies changing moles, rash, lumps ENT: -runny nose, -ear pain, -sore throat,  Cardiology:  -chest pain, -palpitations, -orthopnea, Respiratory: -cough, -shortness of breath, -dyspnea on exertion, -wheezing,  Gastroenterology: -abdominal pain, -nausea, -vomiting, -diarrhea, -constipation, -dysphagia Hematology: -bleeding or bruising problems Musculoskeletal: -arthralgias, -myalgias, -joint swelling, -back pain, -  Ophthalmology: -vision changes,  Urology: -dysuria, -difficulty urinating,  -urinary  frequency, -urgency, incontinence Neurology: -, -numbness, , -memory loss, -falls, -dizziness    PHYSICAL EXAM:  Wt 227 lb (103 kg)   BMI 33.52 kg/m   General Appearance: Alert, cooperative, no distress, appears stated age Head: Normocephalic, without obvious abnormality, atraumatic Skin: Skin color, texture, turgor normal, visual inspection of his right antecubital fossa does show splotchy erythematous lesions but nothing diagnostic.  Psych: Normal mood, affect, hygiene and grooming  ASSESSMENT/PLAN: Routine general medical examination at a health care facility  Hyperlipidemia with target LDL less than 100 - Plan: Lipid panel  Essential hypertension - Plan: Comprehensive metabolic panel, CBC with Differential/Platelet, lisinopril-hydrochlorothiazide (ZESTORETIC) 10-12.5 MG tablet  Obesity (BMI 30-39.9) - Plan: Comprehensive metabolic panel, Lipid panel, CBC with Differential/Platelet  Rash and nonspecific skin eruption Recommend he continue with triamcinolone which he is been using but do it regularly for 2 to 3 weeks and see if this will make it go away.  Explained that we might need to work on controlling this rather than curing it.  Also he will probably need to come in at a later date to have this visually inspected. Encouraged him to remain physically active and I will renew his medications.    recommended at least 30 minutes of aerobic activity at least 5 days/week; proper sunscreen use reviewed; healthy diet and alcohol recommendations (less than or equal to 2 drinks/day) reviewed; regular seatbelt use; changing batteries in smoke detectors. Immunization recommendations discussed.  Colonoscopy recommendations reviewed.   Medicare Attestation I have personally reviewed: The patient's medical and social history Their use of alcohol, tobacco or illicit drugs Their current medications and supplements The patient's functional ability including ADLs,fall risks, home safety  risks, cognitive, and hearing and visual impairment Diet and physical activities Evidence for depression or mood disorders  The patient's weight, height, and BMI have been recorded in the chart.  I have made referrals, counseling, and provided education to the patient based on review of the above and I have provided the patient with a written personalized care plan for preventive services.     Sharlot Gowda, MD   06/12/2018

## 2018-06-19 ENCOUNTER — Other Ambulatory Visit: Payer: Medicare Other

## 2018-06-19 ENCOUNTER — Other Ambulatory Visit: Payer: Self-pay

## 2018-06-19 DIAGNOSIS — I10 Essential (primary) hypertension: Secondary | ICD-10-CM

## 2018-06-19 DIAGNOSIS — E669 Obesity, unspecified: Secondary | ICD-10-CM

## 2018-06-19 DIAGNOSIS — E785 Hyperlipidemia, unspecified: Secondary | ICD-10-CM

## 2018-06-20 LAB — CBC WITH DIFFERENTIAL/PLATELET
Basophils Absolute: 0.1 10*3/uL (ref 0.0–0.2)
Basos: 1 %
EOS (ABSOLUTE): 0.2 10*3/uL (ref 0.0–0.4)
Eos: 3 %
Hematocrit: 44.5 % (ref 37.5–51.0)
Hemoglobin: 14.7 g/dL (ref 13.0–17.7)
Immature Grans (Abs): 0 10*3/uL (ref 0.0–0.1)
Immature Granulocytes: 0 %
Lymphocytes Absolute: 2.2 10*3/uL (ref 0.7–3.1)
Lymphs: 27 %
MCH: 28.4 pg (ref 26.6–33.0)
MCHC: 33 g/dL (ref 31.5–35.7)
MCV: 86 fL (ref 79–97)
Monocytes Absolute: 0.6 10*3/uL (ref 0.1–0.9)
Monocytes: 8 %
Neutrophils Absolute: 5 10*3/uL (ref 1.4–7.0)
Neutrophils: 61 %
Platelets: 236 10*3/uL (ref 150–450)
RBC: 5.17 x10E6/uL (ref 4.14–5.80)
RDW: 14.4 % (ref 11.6–15.4)
WBC: 8.2 10*3/uL (ref 3.4–10.8)

## 2018-06-20 LAB — COMPREHENSIVE METABOLIC PANEL
ALT: 23 IU/L (ref 0–44)
AST: 22 IU/L (ref 0–40)
Albumin/Globulin Ratio: 1.4 (ref 1.2–2.2)
Albumin: 4.1 g/dL (ref 3.7–4.7)
Alkaline Phosphatase: 80 IU/L (ref 39–117)
BUN/Creatinine Ratio: 18 (ref 10–24)
BUN: 31 mg/dL — ABNORMAL HIGH (ref 8–27)
Bilirubin Total: 0.4 mg/dL (ref 0.0–1.2)
CO2: 20 mmol/L (ref 20–29)
Calcium: 9 mg/dL (ref 8.6–10.2)
Chloride: 100 mmol/L (ref 96–106)
Creatinine, Ser: 1.77 mg/dL — ABNORMAL HIGH (ref 0.76–1.27)
GFR calc Af Amer: 42 mL/min/{1.73_m2} — ABNORMAL LOW (ref >59)
GFR calc non Af Amer: 37 mL/min/{1.73_m2} — ABNORMAL LOW (ref >59)
Globulin, Total: 3 g/dL (ref 1.5–4.5)
Glucose: 105 mg/dL — ABNORMAL HIGH (ref 65–99)
Potassium: 4.2 mmol/L (ref 3.5–5.2)
Sodium: 137 mmol/L (ref 134–144)
Total Protein: 7.1 g/dL (ref 6.0–8.5)

## 2018-06-20 LAB — LIPID PANEL
Chol/HDL Ratio: 5.7 ratio — ABNORMAL HIGH (ref 0.0–5.0)
Cholesterol, Total: 234 mg/dL — ABNORMAL HIGH (ref 100–199)
HDL: 41 mg/dL (ref >39)
LDL Calculated: 160 mg/dL — ABNORMAL HIGH (ref 0–99)
Triglycerides: 163 mg/dL — ABNORMAL HIGH (ref 0–149)
VLDL Cholesterol Cal: 33 mg/dL (ref 5–40)

## 2018-06-30 IMAGING — MR MR HEAD W/O CM
9 of 11 series · 33 of 48 positions shown · non-contrast
Comparison: None available.

CLINICAL DATA: Initial evaluation for acute vertigo.

EXAM:
MRI HEAD WITHOUT CONTRAST
TECHNIQUE: Multiplanar, multiecho pulse sequences of the brain and surrounding
structures were obtained without intravenous contrast.

[Series 3: DWI · axial · 3.0mm · 0.94mm/px · z∈[-84,+63]mm · 8 of 99 slices shown (1 of 2)]
[im 1/99]
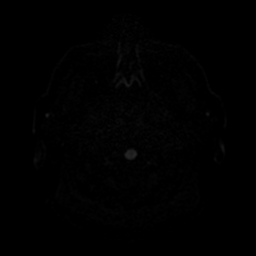
[im 15/99]
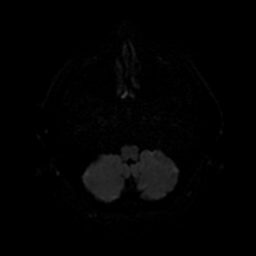
[im 29/99]
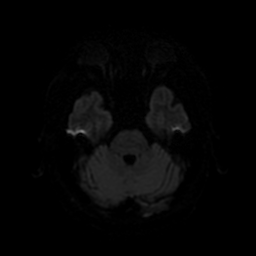
[im 43/99]
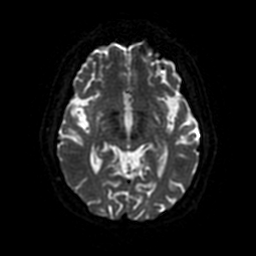
[im 57/99]
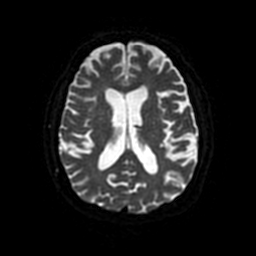
[im 71/99]
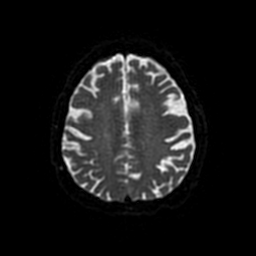
[im 85/99]
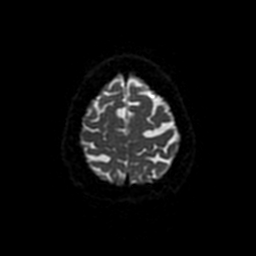
[im 99/99]
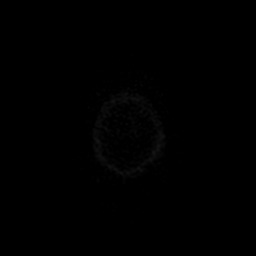

[Series 4: DWI · coronal · 4.0mm · 0.94mm/px · 5 of 66 slices shown (2 of 2)]
[im 1/66]
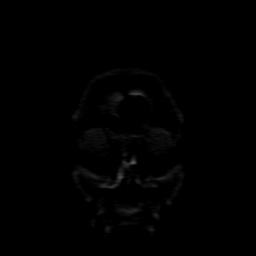
[im 17/66]
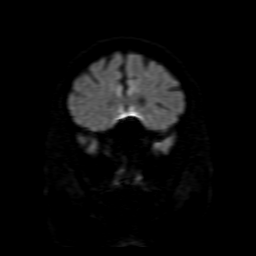
[im 33/66]
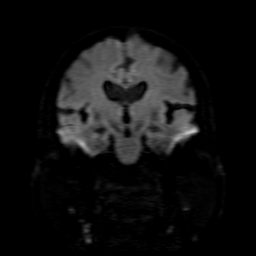
[im 49/66]
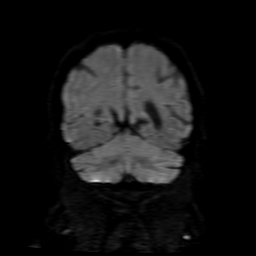
[im 66/66]
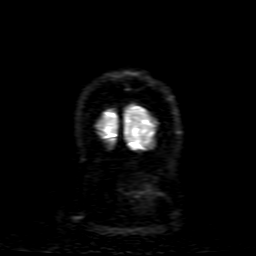

[Series 5: FLAIR · sagittal · 5.0mm · 0.47mm/px · 2 of 23 slices shown (1 of 2)]
[im 1/23]
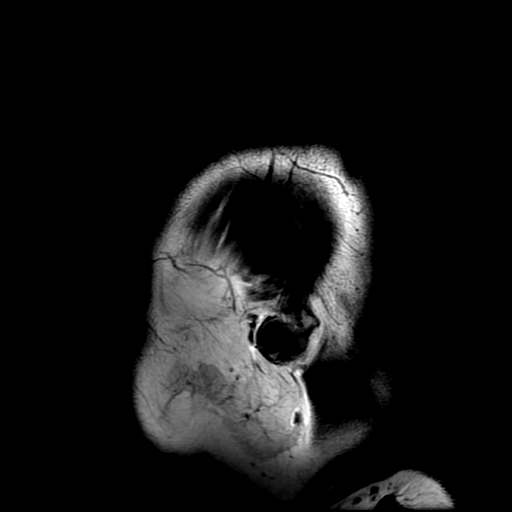
[im 23/23]
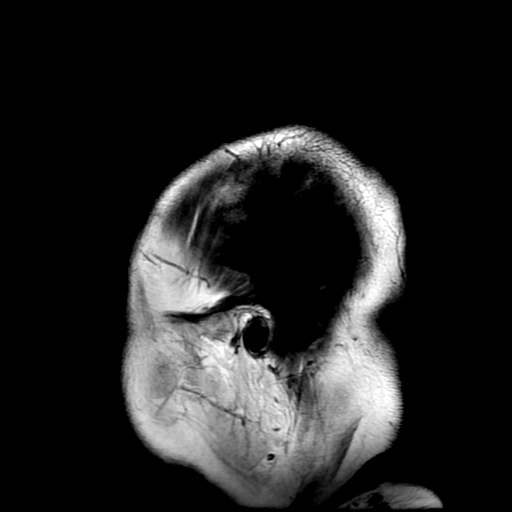

[Series 7: T2 · axial · 5.0mm · 0.47mm/px · z∈[-103,+46]mm · 2 of 26 slices shown (1 of 2)]
[im 1/26]
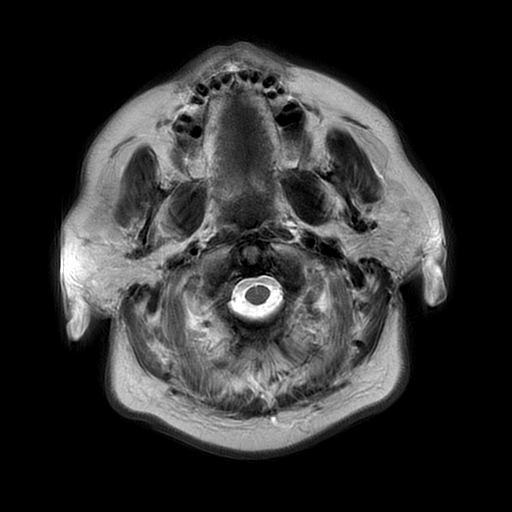
[im 26/26]
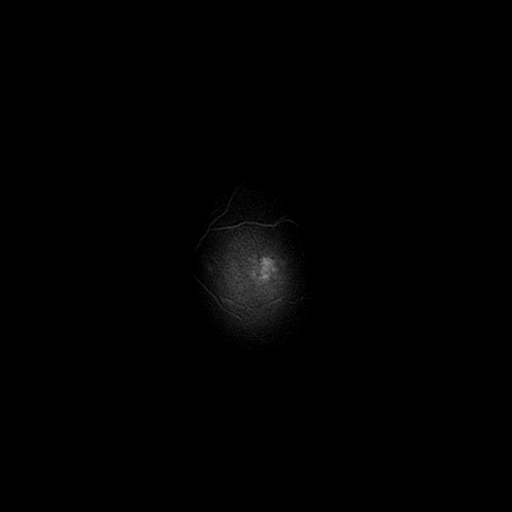

[Series 8: (person_name) · axial · 3.0mm · 0.47mm/px · z∈[-106,-22]mm · 5 of 100 slices shown]
[im 1/100]
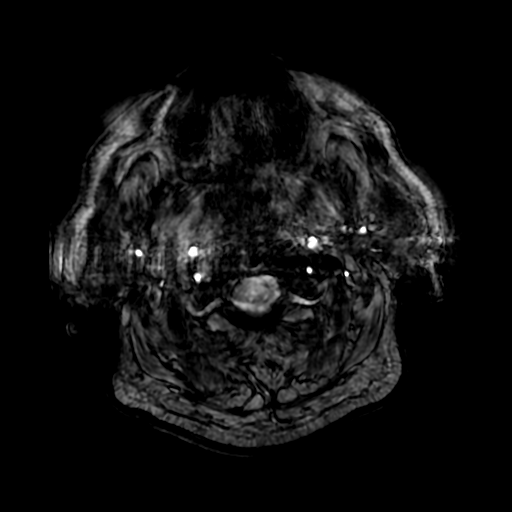
[im 15/100]
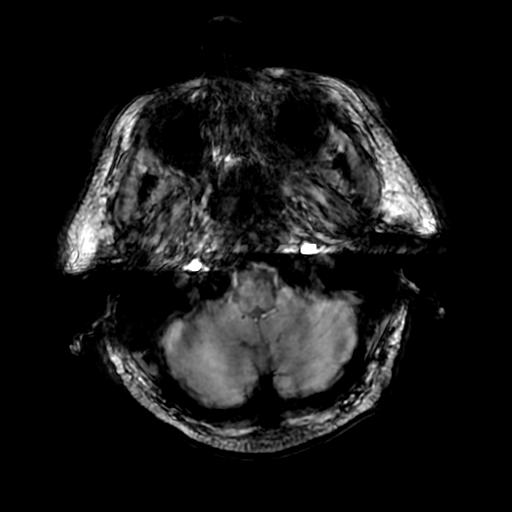
[im 29/100]
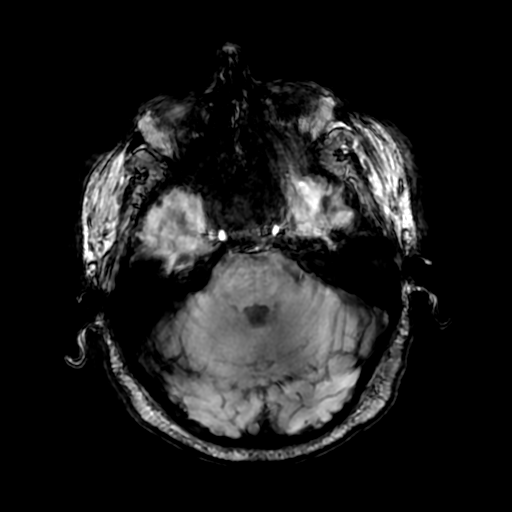
[im 43/100]
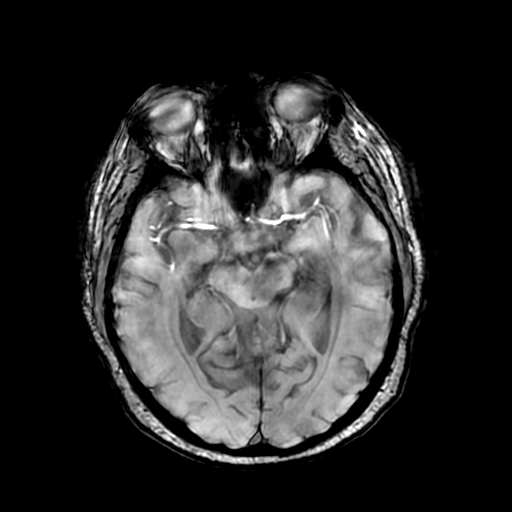
[im 57/100]
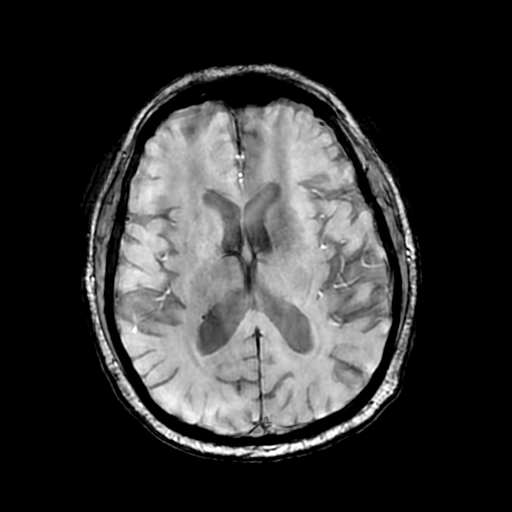

[Series 9: FLAIR · axial · 5.0mm · 0.47mm/px · z∈[-103,+46]mm · 2 of 26 slices shown (2 of 2)]
[im 1/26]
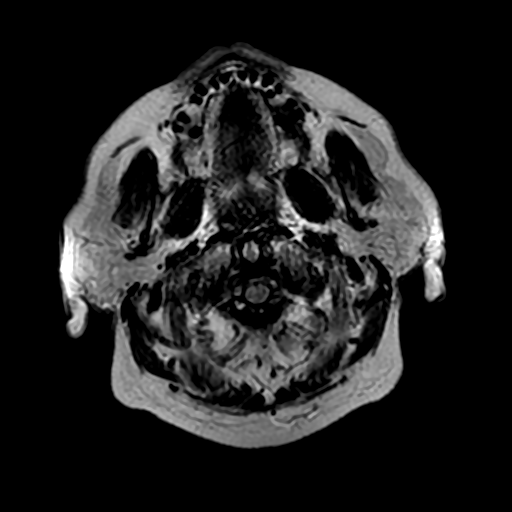
[im 26/26]
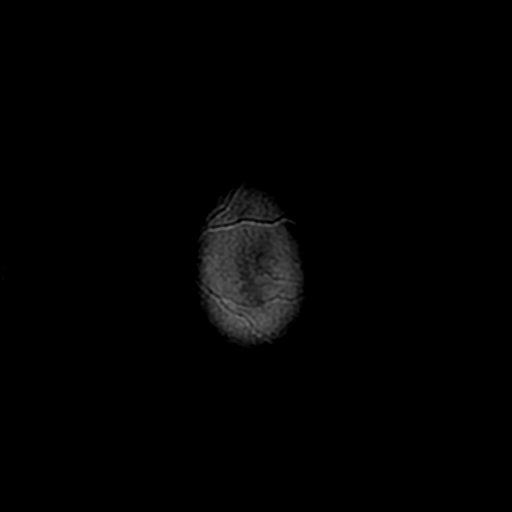

[Series 11: T2 · coronal · 5.0mm · 0.39mm/px · 2 of 28 slices shown (2 of 2)]
[im 1/28]
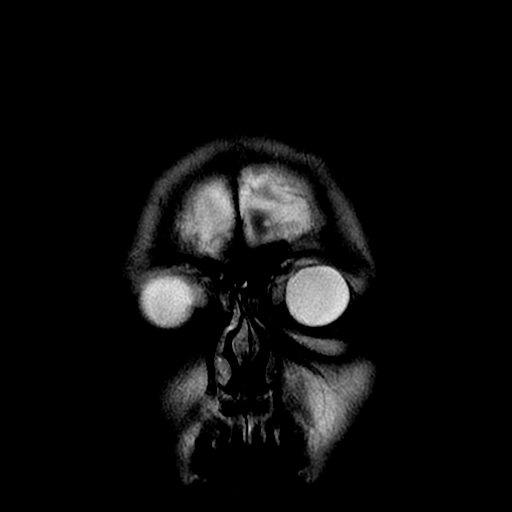
[im 28/28]
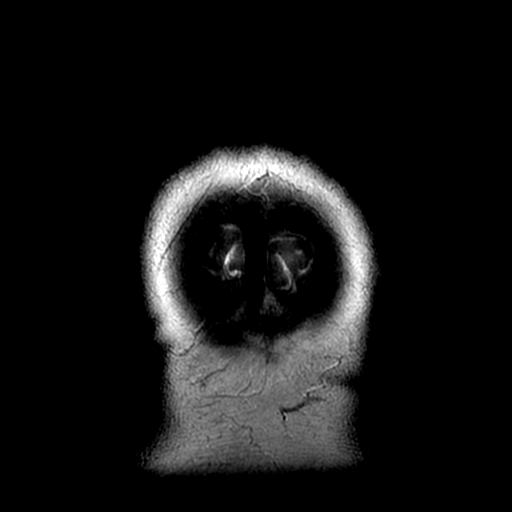

[Series 350: ADC · axial · 3.0mm · 0.94mm/px · z∈[-84,+63]mm · 4 of 50 slices shown (1 of 2)]
[im 1/50]
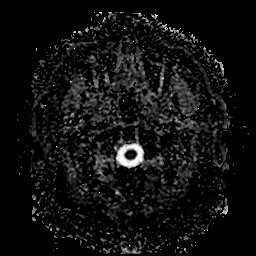
[im 17/50]
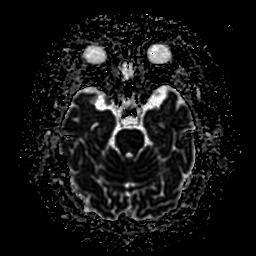
[im 33/50]
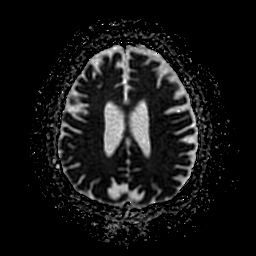
[im 50/50]
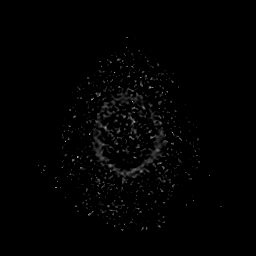

[Series 450: ADC · coronal · 4.0mm · 0.94mm/px · 3 of 33 slices shown (2 of 2)]
[im 1/33]
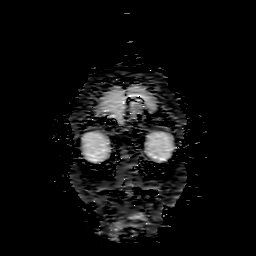
[im 17/33]
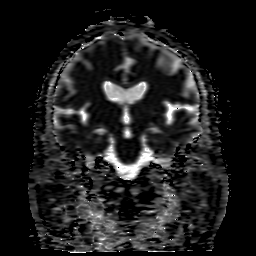
[im 33/33]
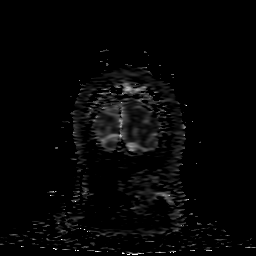

[33 of 48 positions shown; findings below may reference images not displayed]

FINDINGS: Brain: Generalized age related cerebral volume loss. No significant
cerebral white matter disease for age. No abnormal foci of
restricted diffusion to suggest acute or subacute ischemia. No
evidence for chronic infarction. No evidence for acute or chronic
intracranial hemorrhage.

No mass lesion, midline shift or mass effect. No hydrocephalus. No
extra-axial fluid collection. Major dural sinuses are grossly
patent.

Pituitary gland suprasellar region within normal limits.

Vascular: Major intracranial vascular flow voids are maintained.

Skull and upper cervical spine: Craniocervical junction normal.
Visualized upper cervical spine unremarkable. Bone marrow signal
intensity within normal limits. No scalp soft tissue abnormality.

Sinuses/Orbits: Globes normal soft tissues within normal limits.
Patient status post lens extraction bilaterally. Mild scattered
mucosal thickening within the ethmoidal air cells. Paranasal sinuses
are otherwise clear. No mastoid effusion. Inner ear structures
normal.
IMPRESSION: Normal brain MRI for patient age. No acute intracranial process
identified.

## 2019-02-28 DIAGNOSIS — B360 Pityriasis versicolor: Secondary | ICD-10-CM | POA: Diagnosis not present

## 2019-03-13 DIAGNOSIS — Z961 Presence of intraocular lens: Secondary | ICD-10-CM | POA: Diagnosis not present

## 2019-03-13 DIAGNOSIS — H33199 Other retinoschisis and retinal cysts, unspecified eye: Secondary | ICD-10-CM | POA: Diagnosis not present

## 2019-03-13 DIAGNOSIS — H353131 Nonexudative age-related macular degeneration, bilateral, early dry stage: Secondary | ICD-10-CM | POA: Diagnosis not present

## 2019-03-13 DIAGNOSIS — H348322 Tributary (branch) retinal vein occlusion, left eye, stable: Secondary | ICD-10-CM | POA: Diagnosis not present

## 2019-03-13 DIAGNOSIS — H26493 Other secondary cataract, bilateral: Secondary | ICD-10-CM | POA: Diagnosis not present

## 2019-06-14 ENCOUNTER — Other Ambulatory Visit: Payer: Self-pay | Admitting: Family Medicine

## 2019-06-14 DIAGNOSIS — I10 Essential (primary) hypertension: Secondary | ICD-10-CM

## 2019-06-17 ENCOUNTER — Ambulatory Visit: Payer: Medicare PPO | Admitting: Family Medicine

## 2019-06-17 ENCOUNTER — Other Ambulatory Visit: Payer: Self-pay

## 2019-06-17 ENCOUNTER — Encounter: Payer: Self-pay | Admitting: Family Medicine

## 2019-06-17 VITALS — BP 134/86 | HR 84 | Temp 97.5°F | Wt 228.4 lb

## 2019-06-17 DIAGNOSIS — I10 Essential (primary) hypertension: Secondary | ICD-10-CM | POA: Diagnosis not present

## 2019-06-17 DIAGNOSIS — E785 Hyperlipidemia, unspecified: Secondary | ICD-10-CM | POA: Diagnosis not present

## 2019-06-17 DIAGNOSIS — M1990 Unspecified osteoarthritis, unspecified site: Secondary | ICD-10-CM | POA: Diagnosis not present

## 2019-06-17 DIAGNOSIS — N1831 Chronic kidney disease, stage 3a: Secondary | ICD-10-CM

## 2019-06-17 DIAGNOSIS — E669 Obesity, unspecified: Secondary | ICD-10-CM | POA: Diagnosis not present

## 2019-06-17 LAB — COMPREHENSIVE METABOLIC PANEL
ALT: 23 IU/L (ref 0–44)
AST: 26 IU/L (ref 0–40)
Albumin/Globulin Ratio: 1.3 (ref 1.2–2.2)
Albumin: 4 g/dL (ref 3.7–4.7)
Alkaline Phosphatase: 88 IU/L (ref 39–117)
BUN/Creatinine Ratio: 17 (ref 10–24)
BUN: 27 mg/dL (ref 8–27)
Bilirubin Total: 0.5 mg/dL (ref 0.0–1.2)
CO2: 19 mmol/L — ABNORMAL LOW (ref 20–29)
Calcium: 10.3 mg/dL — ABNORMAL HIGH (ref 8.6–10.2)
Chloride: 98 mmol/L (ref 96–106)
Creatinine, Ser: 1.58 mg/dL — ABNORMAL HIGH (ref 0.76–1.27)
GFR calc Af Amer: 48 mL/min/{1.73_m2} — ABNORMAL LOW (ref >59)
GFR calc non Af Amer: 42 mL/min/{1.73_m2} — ABNORMAL LOW (ref >59)
Globulin, Total: 3.1 g/dL (ref 1.5–4.5)
Glucose: 108 mg/dL — ABNORMAL HIGH (ref 65–99)
Potassium: 4.1 mmol/L (ref 3.5–5.2)
Sodium: 137 mmol/L (ref 134–144)
Total Protein: 7.1 g/dL (ref 6.0–8.5)

## 2019-06-17 LAB — LIPID PANEL
Chol/HDL Ratio: 4.8 ratio (ref 0.0–5.0)
Cholesterol, Total: 218 mg/dL — ABNORMAL HIGH (ref 100–199)
HDL: 45 mg/dL (ref >39)
LDL Chol Calc (NIH): 152 mg/dL — ABNORMAL HIGH (ref 0–99)
Triglycerides: 118 mg/dL (ref 0–149)
VLDL Cholesterol Cal: 21 mg/dL (ref 5–40)

## 2019-06-17 LAB — CBC WITH DIFFERENTIAL/PLATELET
Basophils Absolute: 0.1 10*3/uL (ref 0.0–0.2)
Basos: 1 %
EOS (ABSOLUTE): 0.3 10*3/uL (ref 0.0–0.4)
Eos: 5 %
Hematocrit: 44.1 % (ref 37.5–51.0)
Hemoglobin: 14.8 g/dL (ref 13.0–17.7)
Immature Grans (Abs): 0 10*3/uL (ref 0.0–0.1)
Immature Granulocytes: 0 %
Lymphocytes Absolute: 2 10*3/uL (ref 0.7–3.1)
Lymphs: 30 %
MCH: 29 pg (ref 26.6–33.0)
MCHC: 33.6 g/dL (ref 31.5–35.7)
MCV: 87 fL (ref 79–97)
Monocytes Absolute: 0.6 10*3/uL (ref 0.1–0.9)
Monocytes: 10 %
Neutrophils Absolute: 3.7 10*3/uL (ref 1.4–7.0)
Neutrophils: 54 %
Platelets: 234 10*3/uL (ref 150–450)
RBC: 5.1 x10E6/uL (ref 4.14–5.80)
RDW: 14.5 % (ref 11.6–15.4)
WBC: 6.7 10*3/uL (ref 3.4–10.8)

## 2019-06-17 MED ORDER — LISINOPRIL-HYDROCHLOROTHIAZIDE 10-12.5 MG PO TABS: 1.0000 | ORAL_TABLET | Freq: Every day | ORAL | 3 refills | Status: DC

## 2019-06-17 NOTE — Progress Notes (Addendum)
   Subjective:    Patient ID: Stephen Padilla, male    DOB: Jan 31, 1946, 74 y.o.   MRN: [id number]  HPI He is here for medication management visit.  He has no particular concerns or complaints.  He is now retired and enjoying his retirement.  He does take glucosamine and chondroitin for arthritis.  He also continues on multivitamins.  Taking lisinopril/HCTZ and having no difficulty with that.  He does have a history of hyperlipidemia.   Review of Systems     Objective:   Physical Exam Alert and in no distress. Tympanic membranes and canals are normal. Pharyngeal area is normal. Neck is supple without adenopathy or thyromegaly. Cardiac exam shows a regular sinus rhythm without murmurs or gallops. Lungs are clear to auscultation.        Assessment & Plan:  Essential hypertension - Plan: CBC with Differential/Platelet, Comprehensive metabolic panel, lisinopril-hydrochlorothiazide (ZESTORETIC) 10-12.5 MG tablet  Hyperlipidemia with target LDL less than 100 - Plan: Lipid panel  Obesity (BMI 30-39.9) - Plan: CBC with Differential/Platelet, Comprehensive metabolic panel, Lipid panel  Arthritis  Stage 3a chronic kidney disease He seems to be enjoying his retirement.  Encouraged him to become more physically active.  He seems to be keeping himself psychologically and intellectually busy. 5/4 change blood work did show evidence of CKD

## 2019-09-05 ENCOUNTER — Ambulatory Visit: Payer: Medicare PPO | Admitting: Family Medicine

## 2019-09-05 ENCOUNTER — Other Ambulatory Visit: Payer: Self-pay

## 2019-09-05 ENCOUNTER — Encounter: Payer: Self-pay | Admitting: Family Medicine

## 2019-09-05 VITALS — BP 112/72 | HR 84 | Temp 96.9°F | Ht 67.5 in | Wt 228.4 lb

## 2019-09-05 DIAGNOSIS — M1990 Unspecified osteoarthritis, unspecified site: Secondary | ICD-10-CM

## 2019-09-05 DIAGNOSIS — E669 Obesity, unspecified: Secondary | ICD-10-CM | POA: Diagnosis not present

## 2019-09-05 DIAGNOSIS — K429 Umbilical hernia without obstruction or gangrene: Secondary | ICD-10-CM

## 2019-09-05 DIAGNOSIS — Z0000 Encounter for general adult medical examination without abnormal findings: Secondary | ICD-10-CM

## 2019-09-05 DIAGNOSIS — E785 Hyperlipidemia, unspecified: Secondary | ICD-10-CM | POA: Diagnosis not present

## 2019-09-05 DIAGNOSIS — N1832 Chronic kidney disease, stage 3b: Secondary | ICD-10-CM

## 2019-09-05 DIAGNOSIS — I10 Essential (primary) hypertension: Secondary | ICD-10-CM | POA: Diagnosis not present

## 2019-09-05 NOTE — Progress Notes (Signed)
Stephen Padilla is a 74 y.o. male who presents for annual wellness visit,CPE and follow-up on chronic medical conditions.  He has no particular concerns or questions.  He continues on lisinopril/HCTZ with no trouble.  He is also taking multivitamins and does follow-up regularly with his ophthalmologist.  There is a question about macular degeneration but apparently the specialist says that was not the case.  Review of his record indicates he does have evidence of CKD.  He also has complaints about possible umbilical hernia.  He is having no abdominal pain, nausea or vomiting.  He is now retired.  He is not involved in any regular physical activities.  His eating habits are unchanged.  He does not smoke.  He drinks moderately.  He has no other concerns or complaints.  He does have x-ray evidence of arthritis but has very little difficulty and is not using any medications.  Also blood work indicates elevated lipids.   Immunizations and Health Maintenance Immunization History  Administered Date(s) Administered  . Influenza Split 12/10/2010, 01/23/2012  . Influenza, High Dose Seasonal PF 02/04/2015, 01/26/2017, 12/18/2017  . Influenza-Unspecified 01/16/2016, 12/18/2017  . PFIZER SARS-COV-2 Vaccination 03/02/2019, 03/23/2019  . PPD Test 01/04/1980  . Pneumococcal Conjugate-13 06/03/2013  . Pneumococcal-Unspecified 09/04/2007  . Td 12/18/2017  . Tdap 09/04/2007, 12/18/2017  . Zoster 12/17/2010  . Zoster Recombinat (Shingrix) 01/31/2018, 04/06/2018   There are no preventive care reminders to display for this patient.  Last colonoscopy: 10/12/17 Last PSA: 01/23/12 Dentist: Q six month Dr. Moishe Spice Ophtho: yearly Dr. Leeroy Cha Exercise:walking  About 25 min. 2-3 days of the week  Other doctors caring for patient include: Dr. Adela Lank GI  Advanced Directives: Does Patient Have a Medical Advance Directive?: Yes Type of Advance Directive: Living will Does patient want to make changes to medical  advance directive?: Yes (ED - Information included in AVS)  Depression screen:  See questionnaire below.     Depression screen [facility] 2/9 09/05/2019 06/12/2018 05/04/2017 04/19/2016 02/04/2015  Decreased Interest 0 0 - 0 0  Down, Depressed, Hopeless 0 0 0 0 0  PHQ - 2 Score 0 0 0 0 0    Fall Screen: See Questionaire below.   Fall Risk  09/05/2019 06/12/2018 05/04/2017 04/19/2016 02/04/2015  Falls in the past year? 0 0 No No No  Risk for fall due to : No Fall Risks - - - -    ADL screen:  See questionnaire below.  Functional Status Survey: Is the patient deaf or have difficulty hearing?: No Does the patient have difficulty seeing, even when wearing glasses/contacts?: No Does the patient have difficulty concentrating, remembering, or making decisions?: No Does the patient have difficulty walking or climbing stairs?: No Does the patient have difficulty dressing or bathing?: No Does the patient have difficulty doing errands alone such as visiting a doctor's office or shopping?: No   Review of Systems  Constitutional: -, -unexpected weight change, -anorexia, -fatigue Allergy: -sneezing, -itching, -congestion Dermatology: denies changing moles, rash, lumps ENT: -runny nose, -ear pain, -sore throat,  Cardiology:  -chest pain, -palpitations, -orthopnea, Respiratory: -cough, -shortness of breath, -dyspnea on exertion, -wheezing,  Gastroenterology: -abdominal pain, -nausea, -vomiting, -diarrhea, -constipation, -dysphagia Hematology: -bleeding or bruising problems Musculoskeletal: -arthralgias, -myalgias, -joint swelling, -back pain, - Ophthalmology: -vision changes,  Urology: -dysuria, -difficulty urinating,  -urinary frequency, -urgency, incontinence Neurology: -, -numbness, , -memory loss, -falls, -dizziness    PHYSICAL EXAM: General Appearance: Alert, cooperative, no distress, appears stated age Head: Normocephalic, without obvious abnormality, atraumatic  Eyes: PERRL, conjunctiva/corneas  clear, EOM's intact, Ears: Normal TM's and external ear canals Nose: Nares normal, mucosa normal, no drainage or sinus   tenderness Throat: Lips, mucosa, and tongue normal; teeth and gums normal Neck: Supple, no lymphadenopathy, thyroid:no enlargement/tenderness/nodules; no carotid bruit or JVD Lungs: Clear to auscultation bilaterally without wheezes, rales or ronchi; respirations unlabored Heart: Regular rate and rhythm, S1 and S2 normal, no murmur, rub or gallop Abdomen: Soft, non-tender, nondistended, normoactive bowel sounds, umbilical hernia noted no hepatosplenomegaly Extremities: No clubbing, cyanosis or edema Pulses: 2+ and symmetric all extremities Skin: Skin color, texture, turgor normal, no rashes or lesions Lymph nodes: Cervical, supraclavicular, and axillary nodes normal Neurologic: CNII-XII intact, normal strength, sensation and gait; reflexes 2+ and symmetric throughout   Psych: Normal mood, affect, hygiene and grooming  ASSESSMENT/PLAN: Routine general medical examination at a health care facility  Obesity (BMI 30-39.9)  Essential hypertension  Hyperlipidemia with target LDL less than 100  Arthritis  Stage 3b chronic kidney disease  Umbilical hernia without obstruction and without gangrene Encouraged him become more physically active.  Recommend treating arthritis on an as-needed basis and he is comfortable with that.  He will continue on his present medication regimen.  Discussed chronic kidney disease with him and at this point do not think parathyroid evaluation is needed.  His weight has been relatively stable so did not spend a lot of time discussing that with him.  Did discuss the umbilical hernia and at this point no intervention is needed and he is comfortable with that.    proper sunscreen use reviewed; healthy diet reviewed;  Immunization recommendations discussed.  Colonoscopy recommendations reviewed.   Medicare Attestation I have personally  reviewed: The patient's medical and social history Their use of alcohol, tobacco or illicit drugs Their current medications and supplements The patient's functional ability including ADLs,fall risks, home safety risks, cognitive, and hearing and visual impairment Diet and physical activities Evidence for depression or mood disorders  The patient's weight, height, and BMI have been recorded in the chart.  I have made referrals, counseling, and provided education to the patient based on review of the above and I have provided the patient with a written personalized care plan for preventive services.     Sharlot Gowda, MD   09/05/2019

## 2019-12-13 ENCOUNTER — Ambulatory Visit (INDEPENDENT_AMBULATORY_CARE_PROVIDER_SITE_OTHER): Payer: Medicare PPO

## 2019-12-13 ENCOUNTER — Other Ambulatory Visit: Payer: Self-pay

## 2019-12-13 DIAGNOSIS — Z23 Encounter for immunization: Secondary | ICD-10-CM | POA: Diagnosis not present

## 2020-01-16 DIAGNOSIS — K420 Umbilical hernia with obstruction, without gangrene: Secondary | ICD-10-CM | POA: Diagnosis not present

## 2020-01-17 DIAGNOSIS — Z6833 Body mass index (BMI) 33.0-33.9, adult: Secondary | ICD-10-CM | POA: Diagnosis not present

## 2020-01-17 DIAGNOSIS — Z7982 Long term (current) use of aspirin: Secondary | ICD-10-CM | POA: Diagnosis not present

## 2020-01-17 DIAGNOSIS — I10 Essential (primary) hypertension: Secondary | ICD-10-CM | POA: Diagnosis not present

## 2020-01-17 DIAGNOSIS — Z79899 Other long term (current) drug therapy: Secondary | ICD-10-CM | POA: Diagnosis not present

## 2020-01-17 DIAGNOSIS — K420 Umbilical hernia with obstruction, without gangrene: Secondary | ICD-10-CM | POA: Diagnosis not present

## 2020-01-17 DIAGNOSIS — E669 Obesity, unspecified: Secondary | ICD-10-CM | POA: Diagnosis not present

## 2020-01-17 DIAGNOSIS — M19042 Primary osteoarthritis, left hand: Secondary | ICD-10-CM | POA: Diagnosis not present

## 2020-01-17 DIAGNOSIS — M19041 Primary osteoarthritis, right hand: Secondary | ICD-10-CM | POA: Diagnosis not present

## 2020-03-12 ENCOUNTER — Telehealth (INDEPENDENT_AMBULATORY_CARE_PROVIDER_SITE_OTHER): Payer: Medicare PPO | Admitting: Family Medicine

## 2020-03-12 ENCOUNTER — Other Ambulatory Visit: Payer: Self-pay

## 2020-03-12 ENCOUNTER — Encounter: Payer: Self-pay | Admitting: Family Medicine

## 2020-03-12 VITALS — BP 124/80 | HR 77 | Temp 97.3°F | Ht 69.0 in | Wt 232.2 lb

## 2020-03-12 DIAGNOSIS — N401 Enlarged prostate with lower urinary tract symptoms: Secondary | ICD-10-CM | POA: Diagnosis not present

## 2020-03-12 DIAGNOSIS — N529 Male erectile dysfunction, unspecified: Secondary | ICD-10-CM | POA: Diagnosis not present

## 2020-03-12 DIAGNOSIS — R3911 Hesitancy of micturition: Secondary | ICD-10-CM | POA: Diagnosis not present

## 2020-03-12 LAB — POCT URINALYSIS DIP (PROADVANTAGE DEVICE)
Bilirubin, UA: NEGATIVE
Blood, UA: NEGATIVE
Glucose, UA: NEGATIVE mg/dL
Ketones, POC UA: NEGATIVE mg/dL
Leukocytes, UA: NEGATIVE
Nitrite, UA: NEGATIVE
Specific Gravity, Urine: 1.015
Urobilinogen, Ur: 0.2
pH, UA: 7 (ref 5.0–8.0)

## 2020-03-12 MED ORDER — SILDENAFIL CITRATE 20 MG PO TABS: ORAL_TABLET | ORAL | 0 refills | Status: DC

## 2020-03-12 MED ORDER — FINASTERIDE 5 MG PO TABS: 5.0000 mg | ORAL_TABLET | Freq: Every day | ORAL | 3 refills | Status: DC

## 2020-03-12 NOTE — Progress Notes (Signed)
   Subjective:    Patient ID: Stephen Padilla, male    DOB: [birth year], 75 y.o.   MRN: [id number]  HPI He is here for consult concerning urinary symptoms. He states that for the last year he has noted increasing intermittent difficulty with incomplete emptying, decreased stream, hesitancy. He has had no incontinence during that timeframe. No dysuria or discharge. He did mention difficulty with erectile dysfunction.    Review of Systems     Objective:   Physical Exam Alert and in no distress. Rectal exam does show a relatively normal, nontender prostate with no nodules palpable.       Assessment & Plan:  Benign prostatic hyperplasia with urinary hesitancy - Plan: POCT Urinalysis DIP (Proadvantage Device), finasteride (PROSCAR) 5 MG tablet  Erectile dysfunction, unspecified erectile dysfunction type - Plan: sildenafil (REVATIO) 20 MG tablet I explained that his urinary symptoms are more BPH in nature. Explained the use of Proscar with him and possible side effects of the medication. Explained that it would can sometimes take several months to get full effect of the medication. Then discussed the erectile dysfunction with him and the use of the medication in terms of proper dosing, side effects and efficacy. All questions were answered. He will keep me informed as to the efficacy. 32 minutes spent in counseling and coordination of care.

## 2020-03-14 ENCOUNTER — Telehealth: Payer: Self-pay

## 2020-03-14 NOTE — Telephone Encounter (Signed)
P.A. SILDENAFIL  

## 2020-03-16 NOTE — Telephone Encounter (Signed)
P.A. denied, medicare exclusion

## 2020-03-17 NOTE — Telephone Encounter (Signed)
Called pharmacy and pt already picked up for $99 for 30.  Called pt and advised when due for next refill,  call and will use [facility] and find cheaper option.

## 2020-04-17 DIAGNOSIS — Z961 Presence of intraocular lens: Secondary | ICD-10-CM | POA: Diagnosis not present

## 2020-04-17 DIAGNOSIS — H26493 Other secondary cataract, bilateral: Secondary | ICD-10-CM | POA: Diagnosis not present

## 2020-04-17 DIAGNOSIS — H353131 Nonexudative age-related macular degeneration, bilateral, early dry stage: Secondary | ICD-10-CM | POA: Diagnosis not present

## 2020-04-17 DIAGNOSIS — H33199 Other retinoschisis and retinal cysts, unspecified eye: Secondary | ICD-10-CM | POA: Diagnosis not present

## 2020-04-17 DIAGNOSIS — H348322 Tributary (branch) retinal vein occlusion, left eye, stable: Secondary | ICD-10-CM | POA: Diagnosis not present

## 2020-07-08 ENCOUNTER — Other Ambulatory Visit: Payer: Self-pay | Admitting: Family Medicine

## 2020-07-08 DIAGNOSIS — I10 Essential (primary) hypertension: Secondary | ICD-10-CM

## 2020-08-18 ENCOUNTER — Other Ambulatory Visit: Payer: Self-pay

## 2020-08-18 ENCOUNTER — Encounter: Payer: Self-pay | Admitting: Family Medicine

## 2020-08-18 ENCOUNTER — Ambulatory Visit: Payer: Medicare PPO | Admitting: Family Medicine

## 2020-08-18 ENCOUNTER — Ambulatory Visit
Admission: RE | Admit: 2020-08-18 | Discharge: 2020-08-18 | Disposition: A | Discharge status: Home / Self Care | Payer: Medicare PPO | Source: Ambulatory Visit | Attending: Family Medicine | Admitting: Family Medicine

## 2020-08-18 VITALS — BP 110/76 | HR 84 | Temp 97.0°F | Ht 69.0 in | Wt 231.6 lb

## 2020-08-18 DIAGNOSIS — M25562 Pain in left knee: Secondary | ICD-10-CM | POA: Diagnosis not present

## 2020-08-18 DIAGNOSIS — G8929 Other chronic pain: Secondary | ICD-10-CM

## 2020-08-18 NOTE — Progress Notes (Signed)
   Subjective:    Patient ID: Stephen Padilla, male    DOB: May 24, 1945, 75 y.o.   MRN: [id number]  HPI He states that in February he slipped going out a door causing him to plant his left foot and externally rotate causing immediate medial pain and swelling.  He did not seek immediate care but has since had difficulty with medial knee pain since then and is now noticing some hip and back trouble because he is walking in an abnormal manner.   Review of Systems     Objective:   Physical Exam Exam of the left knee shows no effusion.  Tender to palpation over the medial joint line.  Medial collateral ligament intact.  Lateral collateral ligament normal.  Anterior drawer is positive.  McMurray's testing medially was pain-free however lateral McMurray's testing causes medial pain.       Assessment & Plan:  Chronic pain of left knee - Plan: DG Knee Complete 4 Views Left, Ambulatory referral to Physical Therapy I will get an x-ray to make sure there is no bony abnormalities.  I then explained that I thought he had damage to his ACL and probable medial meniscus.  We are down the road several months and I think at this point it is reasonable to see if rehab can help stabilize this.  Discussed also possible orthopedic referral at this point but he is comfortable with trying rehab first.  He is scheduled return here in 3 weeks for a complete exam and we will reevaluate him at that point.

## 2020-08-19 DIAGNOSIS — M545 Low back pain, unspecified: Secondary | ICD-10-CM | POA: Diagnosis not present

## 2020-08-19 DIAGNOSIS — M2560 Stiffness of unspecified joint, not elsewhere classified: Secondary | ICD-10-CM | POA: Diagnosis not present

## 2020-08-19 DIAGNOSIS — R531 Weakness: Secondary | ICD-10-CM | POA: Diagnosis not present

## 2020-08-19 DIAGNOSIS — M79605 Pain in left leg: Secondary | ICD-10-CM | POA: Diagnosis not present

## 2020-08-24 DIAGNOSIS — R531 Weakness: Secondary | ICD-10-CM | POA: Diagnosis not present

## 2020-08-24 DIAGNOSIS — M2560 Stiffness of unspecified joint, not elsewhere classified: Secondary | ICD-10-CM | POA: Diagnosis not present

## 2020-08-24 DIAGNOSIS — M545 Low back pain, unspecified: Secondary | ICD-10-CM | POA: Diagnosis not present

## 2020-08-24 DIAGNOSIS — M79605 Pain in left leg: Secondary | ICD-10-CM | POA: Diagnosis not present

## 2020-08-27 DIAGNOSIS — M2560 Stiffness of unspecified joint, not elsewhere classified: Secondary | ICD-10-CM | POA: Diagnosis not present

## 2020-08-27 DIAGNOSIS — R531 Weakness: Secondary | ICD-10-CM | POA: Diagnosis not present

## 2020-08-27 DIAGNOSIS — M79605 Pain in left leg: Secondary | ICD-10-CM | POA: Diagnosis not present

## 2020-08-27 DIAGNOSIS — M545 Low back pain, unspecified: Secondary | ICD-10-CM | POA: Diagnosis not present

## 2020-09-08 ENCOUNTER — Other Ambulatory Visit: Payer: Self-pay

## 2020-09-08 ENCOUNTER — Ambulatory Visit: Payer: Medicare PPO | Admitting: Family Medicine

## 2020-09-08 ENCOUNTER — Encounter: Payer: Self-pay | Admitting: Family Medicine

## 2020-09-08 VITALS — BP 108/68 | HR 88 | Temp 97.4°F | Ht 69.0 in | Wt 230.4 lb

## 2020-09-08 DIAGNOSIS — Z0000 Encounter for general adult medical examination without abnormal findings: Secondary | ICD-10-CM | POA: Diagnosis not present

## 2020-09-08 DIAGNOSIS — N529 Male erectile dysfunction, unspecified: Secondary | ICD-10-CM

## 2020-09-08 DIAGNOSIS — N401 Enlarged prostate with lower urinary tract symptoms: Secondary | ICD-10-CM | POA: Diagnosis not present

## 2020-09-08 DIAGNOSIS — E785 Hyperlipidemia, unspecified: Secondary | ICD-10-CM | POA: Diagnosis not present

## 2020-09-08 DIAGNOSIS — R739 Hyperglycemia, unspecified: Secondary | ICD-10-CM | POA: Diagnosis not present

## 2020-09-08 DIAGNOSIS — M1990 Unspecified osteoarthritis, unspecified site: Secondary | ICD-10-CM | POA: Diagnosis not present

## 2020-09-08 DIAGNOSIS — I10 Essential (primary) hypertension: Secondary | ICD-10-CM

## 2020-09-08 DIAGNOSIS — E669 Obesity, unspecified: Secondary | ICD-10-CM | POA: Diagnosis not present

## 2020-09-08 DIAGNOSIS — N1832 Chronic kidney disease, stage 3b: Secondary | ICD-10-CM

## 2020-09-08 DIAGNOSIS — G8929 Other chronic pain: Secondary | ICD-10-CM

## 2020-09-08 DIAGNOSIS — R3911 Hesitancy of micturition: Secondary | ICD-10-CM | POA: Diagnosis not present

## 2020-09-08 DIAGNOSIS — M25562 Pain in left knee: Secondary | ICD-10-CM

## 2020-09-08 MED ORDER — TADALAFIL 20 MG PO TABS: 20.0000 mg | ORAL_TABLET | Freq: Every day | ORAL | 5 refills | Status: DC | PRN

## 2020-09-08 MED ORDER — ATORVASTATIN CALCIUM 40 MG PO TABS: 40.0000 mg | ORAL_TABLET | Freq: Every day | ORAL | 3 refills | Status: DC

## 2020-09-08 MED ORDER — LISINOPRIL-HYDROCHLOROTHIAZIDE 10-12.5 MG PO TABS: 1.0000 | ORAL_TABLET | Freq: Every day | ORAL | 3 refills | Status: DC

## 2020-09-08 NOTE — Progress Notes (Signed)
Stephen Padilla is a 75 y.o. male who presents for annual wellness visit ,CPE and follow-up on chronic medical conditions.  He has had good results with rehab for his left knee.  He states that he is returning to normal.  He does have some thumb pain but he uses glucosamine/chondroitin with.  He does have underlying ED but finds the sildenafil to make him nauseous and would like to try different medication.  Continues on finasteride to help with his BPH.  Continues on lisinopril/HCTZ and having no difficulty with that.  He does have hyperlipidemia but has not been on a statin yet.  His weight is unchanged.  He does keep himself physically active.  He is not involved in a regular exercise program.  His marriage is going well.  He is now retired and enjoying his retirement.   Immunizations and Health Maintenance Immunization History  Administered Date(s) Administered   Fluad Quad(high Dose 65+) 12/13/2019   Influenza Split 12/10/2010, 01/23/2012   Influenza, High Dose Seasonal PF 02/04/2015, 01/26/2017, 12/18/2017   Influenza-Unspecified 01/16/2016, 12/18/2017   PFIZER(Purple Top)SARS-COV-2 Vaccination 03/02/2019, 03/23/2019, 12/13/2019   PPD Test 01/04/1980   Pneumococcal Conjugate-13 06/03/2013   Pneumococcal-Unspecified 09/04/2007   Td 12/18/2017   Tdap 09/04/2007, 12/18/2017   Zoster Recombinat (Shingrix) 01/31/2018, 04/06/2018   Zoster, Live 12/17/2010   There are no preventive care reminders to display for this patient.  Last colonoscopy: 10/12/2017  Last PSA: 01/23/2012  results-2.25 Dentist: Q six months Ophtho: Q year Exercise: N/A  Other doctors caring for patient include: Dr. Adela Lank GI  Advanced Directives: Does Patient Have a Medical Advance Directive?: Yes Type of Advance Directive: Living will, Healthcare Power of Attorney Does patient want to make changes to medical advance directive?: No - Patient declined Copy of Healthcare Power of Attorney in Chart?: Yes -  validated most recent copy scanned in chart (See row information)  Depression screen:  See questionnaire below.     Depression screen [facility] 2/9 09/08/2020 09/05/2019 06/12/2018 05/04/2017 04/19/2016  Decreased Interest 0 0 0 - 0  Down, Depressed, Hopeless 0 0 0 0 0  PHQ - 2 Score 0 0 0 0 0    Fall Screen: See Questionaire below.   Fall Risk  09/08/2020 09/05/2019 06/12/2018 05/04/2017 04/19/2016  Falls in the past year? 0 0 0 No No  Number falls in past yr: 0 - - - -  Injury with Fall? 0 - - - -  Risk for fall due to : No Fall Risks No Fall Risks - - -  Follow up Falls evaluation completed - - - -    ADL screen:  See questionnaire below.  Functional Status Survey: Is the patient deaf or have difficulty hearing?: No Does the patient have difficulty seeing, even when wearing glasses/contacts?: No Does the patient have difficulty concentrating, remembering, or making decisions?: No Does the patient have difficulty walking or climbing stairs?: No Does the patient have difficulty dressing or bathing?: No Does the patient have difficulty doing errands alone such as visiting a doctor's office or shopping?: No   Review of Systems  Constitutional: -, -unexpected weight change, -anorexia, -fatigue Allergy: -sneezing, -itching, -congestion Dermatology: denies changing moles, rash, lumps ENT: -runny nose, -ear pain, -sore throat,  Cardiology:  -chest pain, -palpitations, -orthopnea, Respiratory: -cough, -shortness of breath, -dyspnea on exertion, -wheezing,  Gastroenterology: -abdominal pain, -nausea, -vomiting, -diarrhea, -constipation, -dysphagia Hematology: -bleeding or bruising problems Musculoskeletal: -arthralgias, -myalgias, -joint swelling, -back pain, - Ophthalmology: -vision changes,  Urology: -  dysuria, -difficulty urinating,  -urinary frequency, -urgency, incontinence Neurology: -, -numbness, , -memory loss, -falls, -dizziness    PHYSICAL EXAM:  General Appearance: Alert,  cooperative, no distress, appears stated age Head: Normocephalic, without obvious abnormality, atraumatic Eyes: PERRL, conjunctiva/corneas clear, EOM's intact, Ears: Normal TM's and external ear canals Nose: Nares normal, mucosa normal, no drainage or sinus   tenderness Throat: Lips, mucosa, and tongue normal; teeth and gums normal Neck: Supple, no lymphadenopathy, thyroid:no enlargement/tenderness/nodules; no carotid bruit or JVD Lungs: Clear to auscultation bilaterally without wheezes, rales or ronchi; respirations unlabored Heart: Regular rate and rhythm, S1 and S2 normal, no murmur, rub or gallop Abdomen: Soft, non-tender, nondistended, normoactive bowel sounds, no masses, no hepatosplenomegaly Skin: Skin color, texture, turgor normal, no rashes or lesions Lymph nodes: Cervical, supraclavicular, and axillary nodes normal Neurologic: CNII-XII intact, normal strength, sensation and gait; reflexes 2+ and symmetric throughout   Psych: Normal mood, affect, hygiene and grooming Left knee exam does show slightly positive anterior drawer but this causes no discomfort.  Negative McMurray's testing. ASSESSMENT/PLAN: Routine general medical examination at a health care facility  Benign prostatic hyperplasia with urinary hesitancy  Erectile dysfunction, unspecified erectile dysfunction type - Plan: tadalafil (CIALIS) 20 MG tablet  Obesity (BMI 30-39.9) - Plan: CBC with Differential/Platelet, Comprehensive metabolic panel, Lipid panel  Essential hypertension - Plan: CBC with Differential/Platelet, Comprehensive metabolic panel, lisinopril-hydrochlorothiazide (ZESTORETIC) 10-12.5 MG tablet  Hyperlipidemia with target LDL less than 100 - Plan: Lipid panel, atorvastatin (LIPITOR) 40 MG tablet  Arthritis  Stage 3b chronic kidney disease (HCC)  Chronic pain of left knee He will continue on his present medications.  I will add Lipitor to his regimen.  Discussed possible side effects of that.  Also  discussed switching to Cialis to help with his ED and possibly with bladder function.  He will call if he has difficulty with that.  Continue to rehab his knee.  If he has further trouble he is to call me.  Immunization recommendations discussed.  Colonoscopy recommendations reviewed.   Medicare Attestation I have personally reviewed: The patient's medical and social history Their use of alcohol, tobacco or illicit drugs Their current medications and supplements The patient's functional ability including ADLs,fall risks, home safety risks, cognitive, and hearing and visual impairment Diet and physical activities Evidence for depression or mood disorders  The patient's weight, height, and BMI have been recorded in the chart.  I have made referrals, counseling, and provided education to the patient based on review of the above and I have provided the patient with a written personalized care plan for preventive services.     Sharlot Gowda, MD   09/08/2020

## 2020-09-09 LAB — CBC WITH DIFFERENTIAL/PLATELET
Basophils Absolute: 0.1 10*3/uL (ref 0.0–0.2)
Basos: 1 %
EOS (ABSOLUTE): 0.3 10*3/uL (ref 0.0–0.4)
Eos: 3 %
Hematocrit: 47.2 % (ref 37.5–51.0)
Hemoglobin: 15 g/dL (ref 13.0–17.7)
Immature Grans (Abs): 0 10*3/uL (ref 0.0–0.1)
Immature Granulocytes: 0 %
Lymphocytes Absolute: 2.1 10*3/uL (ref 0.7–3.1)
Lymphs: 28 %
MCH: 28.1 pg (ref 26.6–33.0)
MCHC: 31.8 g/dL (ref 31.5–35.7)
MCV: 88 fL (ref 79–97)
Monocytes Absolute: 0.7 10*3/uL (ref 0.1–0.9)
Monocytes: 9 %
Neutrophils Absolute: 4.2 10*3/uL (ref 1.4–7.0)
Neutrophils: 59 %
Platelets: 238 10*3/uL (ref 150–450)
RBC: 5.34 x10E6/uL (ref 4.14–5.80)
RDW: 14.1 % (ref 11.6–15.4)
WBC: 7.3 10*3/uL (ref 3.4–10.8)

## 2020-09-09 LAB — COMPREHENSIVE METABOLIC PANEL
ALT: 27 IU/L (ref 0–44)
AST: 30 IU/L (ref 0–40)
Albumin/Globulin Ratio: 1.4 (ref 1.2–2.2)
Albumin: 4.2 g/dL (ref 3.7–4.7)
Alkaline Phosphatase: 94 IU/L (ref 44–121)
BUN/Creatinine Ratio: 16 (ref 10–24)
BUN: 28 mg/dL — ABNORMAL HIGH (ref 8–27)
Bilirubin Total: 0.5 mg/dL (ref 0.0–1.2)
CO2: 23 mmol/L (ref 20–29)
Calcium: 9.1 mg/dL (ref 8.6–10.2)
Chloride: 98 mmol/L (ref 96–106)
Creatinine, Ser: 1.75 mg/dL — ABNORMAL HIGH (ref 0.76–1.27)
Globulin, Total: 2.9 g/dL (ref 1.5–4.5)
Glucose: 142 mg/dL — ABNORMAL HIGH (ref 65–99)
Potassium: 4.1 mmol/L (ref 3.5–5.2)
Sodium: 139 mmol/L (ref 134–144)
Total Protein: 7.1 g/dL (ref 6.0–8.5)
eGFR: 40 mL/min/{1.73_m2} — ABNORMAL LOW (ref >59)

## 2020-09-09 LAB — LIPID PANEL
Chol/HDL Ratio: 5.7 ratio — ABNORMAL HIGH (ref 0.0–5.0)
Cholesterol, Total: 252 mg/dL — ABNORMAL HIGH (ref 100–199)
HDL: 44 mg/dL (ref >39)
LDL Chol Calc (NIH): 175 mg/dL — ABNORMAL HIGH (ref 0–99)
Triglycerides: 176 mg/dL — ABNORMAL HIGH (ref 0–149)
VLDL Cholesterol Cal: 33 mg/dL (ref 5–40)

## 2020-09-12 LAB — SPECIMEN STATUS REPORT

## 2020-09-12 LAB — HGB A1C W/O EAG: Hgb A1c MFr Bld: 6.4 % — ABNORMAL HIGH (ref 4.8–5.6)

## 2020-10-08 ENCOUNTER — Other Ambulatory Visit: Payer: Self-pay | Admitting: Family Medicine

## 2020-10-08 DIAGNOSIS — I10 Essential (primary) hypertension: Secondary | ICD-10-CM

## 2021-06-26 ENCOUNTER — Other Ambulatory Visit: Payer: Self-pay | Admitting: Family Medicine

## 2021-06-26 DIAGNOSIS — I10 Essential (primary) hypertension: Secondary | ICD-10-CM

## 2021-09-06 ENCOUNTER — Telehealth: Payer: Self-pay | Admitting: Family Medicine

## 2021-09-06 NOTE — Telephone Encounter (Signed)
Left message for patient to call back and schedule Medicare Annual Wellness Visit (AWV) either virtually or in office. I left my number for patient to call [phone].  Last AWV ; 09/08/20 please schedule at anytime with health coach

## 2021-09-09 ENCOUNTER — Ambulatory Visit: Payer: Medicare PPO | Admitting: Family Medicine

## 2021-10-01 ENCOUNTER — Ambulatory Visit (INDEPENDENT_AMBULATORY_CARE_PROVIDER_SITE_OTHER): Payer: Medicare PPO

## 2021-10-01 VITALS — Ht 69.0 in | Wt 225.0 lb

## 2021-10-01 DIAGNOSIS — Z0000 Encounter for general adult medical examination without abnormal findings: Secondary | ICD-10-CM

## 2021-10-01 NOTE — Progress Notes (Signed)
I connected with Stephen Padilla today by telephone and verified that I am speaking with the correct person using two identifiers. Location patient: home Location provider: work Persons participating in the virtual visit: Jaedyn, Lard LPN.   I discussed the limitations, risks, security and privacy concerns of performing an evaluation and management service by telephone and the availability of in person appointments. I also discussed with the patient that there may be a patient responsible charge related to this service. The patient expressed understanding and verbally consented to this telephonic visit.    Interactive audio and video telecommunications were attempted between this provider and patient, however failed, due to patient having technical difficulties OR patient did not have access to video capability.  We continued and completed visit with audio only.     Vital signs may be patient reported or missing.  Subjective:   Stephen Padilla is a 76 y.o. male who presents for Medicare Annual/Subsequent preventive examination.  Review of Systems     Cardiac Risk Factors include: advanced age (>54men, >30 women);dyslipidemia;hypertension;male gender;obesity (BMI >30kg/m2)     Objective:    Today's Vitals   10/01/21 1011  Weight: 225 lb (102.1 kg)  Height: 5\' 9"  (1.753 m)   Body mass index is 33.23 kg/m.     10/01/2021   10:15 AM 09/08/2020   10:41 AM 09/05/2019   10:47 AM 06/12/2018   10:55 AM 05/04/2017    9:52 AM 09/06/2016   11:10 AM 04/19/2016    9:07 AM  Advanced Directives  Does Patient Have a Medical Advance Directive? Yes Yes Yes Yes Yes No No  Type of 06/19/2016 of [place];Living will Living will;Healthcare Power of Attorney Living will Healthcare Power of [place];Living will     Does patient want to make changes to medical advance directive?  No - Patient declined Yes (ED - Information included in AVS) No - Patient declined No  - Patient declined    Copy of Healthcare Power of Attorney in Chart? Yes - validated most recent copy scanned in chart (See row information) Yes - validated most recent copy scanned in chart (See row information)  No - copy requested       Current Medications (verified) Outpatient Encounter Medications as of 10/01/2021  Medication Sig   aspirin EC 81 MG tablet Take 81 mg by mouth daily. Takes 1 dailt   atorvastatin (LIPITOR) 40 MG tablet Take 1 tablet (40 mg total) by mouth daily.   finasteride (PROSCAR) 5 MG tablet Take 1 tablet (5 mg total) by mouth daily.   glucosamine-chondroitin 500-400 MG tablet Take 1 tablet by mouth 3 (three) times daily.   lisinopril-hydrochlorothiazide (ZESTORETIC) 10-12.5 MG tablet TAKE 1 TABLET BY MOUTH EVERY DAY   Multiple Vitamin (MULTIVITAMIN) capsule Take 1 capsule by mouth daily.   Multiple Vitamins-Minerals (EYE VITAMINS PO) Take 1 tablet by mouth daily.   tadalafil (CIALIS) 20 MG tablet Take 1 tablet (20 mg total) by mouth daily as needed for erectile dysfunction.   Turmeric (QC TUMERIC COMPLEX PO) Take by mouth.   Ascorbic Acid (VITAMIN C) 100 MG tablet  (Patient not taking: Reported on 09/08/2020)   No facility-administered encounter medications on file as of 10/01/2021.    Allergies (verified) Penicillins   History: Past Medical History:  Diagnosis Date   Allergy    RHINITIS   Alopecia    Arthritis    Cataract    Hemorrhoids    Hypercholesterolemia    Hypertension  Obesity    Past Surgical History:  Procedure Laterality Date   EYE SURGERY  2011   BILAT CATARACTS   Family History  Problem Relation Age of Onset   Hypertension Mother    Heart disease Sister    Colon cancer Neg Hx    Esophageal cancer Neg Hx    Rectal cancer Neg Hx    Stomach cancer Neg Hx    Social History   Socioeconomic History   Marital status: Married    Spouse name: Not on file   Number of children: Not on file   Years of education: Not on file    Highest education level: Not on file  Occupational History   Not on file  Tobacco Use   Smoking status: Never   Smokeless tobacco: Never  Vaping Use   Vaping Use: Never used  Substance and Sexual Activity   Alcohol use: Yes    Comment: occasionally, maybe 1-2 drinks socially (usually once a month)   Drug use: No   Sexual activity: Yes  Other Topics Concern   Not on file  Social History Narrative   Not on file   Social Determinants of Health   Financial Resource Strain: Low Risk  (10/01/2021)   Overall Financial Resource Strain (CARDIA)    Difficulty of Paying Living Expenses: Not hard at all  Food Insecurity: No Food Insecurity (10/01/2021)   Hunger Vital Sign    Worried About Running Out of Food in the Last Year: Never true    Ran Out of Food in the Last Year: Never true  Transportation Needs: No Transportation Needs (10/01/2021)   PRAPARE - Administrator, Civil Service (Medical): No    Lack of Transportation (Non-Medical): No  Physical Activity: Inactive (10/01/2021)   Exercise Vital Sign    Days of Exercise per Week: 0 days    Minutes of Exercise per Session: 0 min  Stress: No Stress Concern Present (10/01/2021)   [employer] of Occupational Health - Occupational Stress Questionnaire    Feeling of Stress : Not at all  Social Connections: Not on file    Tobacco Counseling Counseling given: Not Answered   Clinical Intake:  Pre-visit preparation completed: Yes  Pain : No/denies pain     Nutritional Status: BMI > 30  Obese Nutritional Risks: None Diabetes: No  How often do you need to have someone help you when you read instructions, pamphlets, or other written materials from your doctor or pharmacy?: 1 - Never What is the last grade level you completed in school?: 12th grade  Diabetic? no  Interpreter Needed?: No  Information entered by :: NAllen LPN   Activities of Daily Living    10/01/2021   10:18 AM  In your present state of  health, do you have any difficulty performing the following activities:  Hearing? 0  Vision? 0  Difficulty concentrating or making decisions? 0  Walking or climbing stairs? 0  Dressing or bathing? 0  Doing errands, shopping? 0  Preparing Food and eating ? N  Using the Toilet? N  In the past six months, have you accidently leaked urine? N  Do you have problems with loss of bowel control? N  Managing your Medications? N  Managing your Finances? N  Housekeeping or managing your Housekeeping? N    Patient Care Team: Ronnald Nian, MD as PCP - General (Family Medicine)  Indicate any recent Medical Services you may have received from other than [facility] providers in  the past year (date may be approximate).     Assessment:   This is a routine wellness examination for [place].  Hearing/Vision screen Vision Screening - Comments:: Regular eye exams,  Dietary issues and exercise activities discussed: Current Exercise Habits: The patient does not participate in regular exercise at present   Goals Addressed             This Visit's Progress    Patient Stated       10/01/2021, no goals       Depression Screen    10/01/2021   10:17 AM 09/08/2020   10:42 AM 09/05/2019   10:50 AM 06/12/2018    9:14 AM 05/04/2017    9:32 AM 04/19/2016    8:53 AM 02/04/2015    2:57 PM  PHQ 2/9 Scores  PHQ - 2 Score 0 0 0 0 0 0 0  PHQ- 9 Score 0          Fall Risk    10/01/2021   10:17 AM 09/08/2020   10:42 AM 09/05/2019   10:49 AM 06/12/2018    9:13 AM 05/04/2017    9:32 AM  Fall Risk   Falls in the past year? 0 0 0 0 No  Number falls in past yr: 0 0     Injury with Fall? 0 0     Risk for fall due to : Medication side effect No Fall Risks No Fall Risks    Follow up Falls evaluation completed;Education provided;Falls prevention discussed Falls evaluation completed       FALL RISK PREVENTION PERTAINING TO THE HOME:  Any stairs in or around the home? Yes  If so, are there any without handrails?  No  Home free of loose throw rugs in walkways, pet beds, electrical cords, etc? Yes  Adequate lighting in your home to reduce risk of falls? Yes   ASSISTIVE DEVICES UTILIZED TO PREVENT FALLS:  Life alert? No  Use of a cane, walker or w/c? No  Grab bars in the bathroom? No  Shower chair or bench in shower? Yes  Elevated toilet seat or a handicapped toilet? Yes   TIMED UP AND GO:  Was the test performed? No .      Cognitive Function:        10/01/2021   10:20 AM  6CIT Screen  What Year? 0 points  What month? 0 points  What time? 0 points  Count back from 20 0 points  Months in reverse 2 points  Repeat phrase 0 points  Total Score 2 points    Immunizations Immunization History  Administered Date(s) Administered   Fluad Quad(high Dose 65+) 12/13/2019   Influenza Split 12/10/2010, 01/23/2012   Influenza, High Dose Seasonal PF 02/04/2015, 01/26/2017, 12/18/2017   Influenza-Unspecified 01/16/2016, 12/18/2017   PFIZER(Purple Top)SARS-COV-2 Vaccination 03/02/2019, 03/23/2019, 12/13/2019   PPD Test 01/04/1980   Pneumococcal Conjugate-13 06/03/2013   Pneumococcal-Unspecified 09/04/2007   Td 12/18/2017   Tdap 09/04/2007, 12/18/2017   Zoster Recombinat (Shingrix) 01/31/2018, 04/06/2018   Zoster, Live 12/17/2010    TDAP status: Up to date  Flu Vaccine status: Due, Education has been provided regarding the importance of this vaccine. Advised may receive this vaccine at local pharmacy or Health Dept. Aware to provide a copy of the vaccination record if obtained from local pharmacy or Health Dept. Verbalized acceptance and understanding.  Pneumococcal vaccine status: Up to date  Covid-19 vaccine status: Completed vaccines  Qualifies for Shingles Vaccine? Yes   Zostavax completed Yes  Shingrix Completed?: Yes  Screening Tests Health Maintenance  Topic Date Due   Pneumonia Vaccine 90+ Years old (2 - PPSV23 or PCV20) 06/04/2014   COVID-19 Vaccine (4 - Pfizer series)  02/07/2020   INFLUENZA VACCINE  09/14/2021   TETANUS/TDAP  12/19/2027   Hepatitis C Screening  Completed   Zoster Vaccines- Shingrix  Completed   HPV VACCINES  Aged Out   COLONOSCOPY (Pts 45-51yrs Insurance coverage will need to be confirmed)  Discontinued    Health Maintenance  Health Maintenance Due  Topic Date Due   Pneumonia Vaccine 65+ Years old (2 - PPSV23 or PCV20) 06/04/2014   COVID-19 Vaccine (4 - Pfizer series) 02/07/2020   INFLUENZA VACCINE  09/14/2021    Colorectal cancer screening: No longer required.   Lung Cancer Screening: (Low Dose CT Chest recommended if Age 47-80 years, 30 pack-year currently smoking OR have quit w/in 15years.) does not qualify.   Lung Cancer Screening Referral: no  Additional Screening:  Hepatitis C Screening: does qualify; Completed 04/19/2016  Vision Screening: Recommended annual ophthalmology exams for early detection of glaucoma and other disorders of the eye. Is the patient up to date with their annual eye exam?  Yes  Who is the provider or what is the name of the office in which the patient attends annual eye exams? Can't remember name If pt is not established with a provider, would they like to be referred to a provider to establish care? No .   Dental Screening: Recommended annual dental exams for proper oral hygiene  Community Resource Referral / Chronic Care Management: CRR required this visit?  No   CCM required this visit?  No      Plan:     I have personally reviewed and noted the following in the patient's chart:   Medical and social history Use of alcohol, tobacco or illicit drugs  Current medications and supplements including opioid prescriptions. Patient is not currently taking opioid prescriptions. Functional ability and status Nutritional status Physical activity Advanced directives List of other physicians Hospitalizations, surgeries, and ER visits in previous 12 months Vitals Screenings to include  cognitive, depression, and falls Referrals and appointments  In addition, I have reviewed and discussed with patient certain preventive protocols, quality metrics, and best practice recommendations. A written personalized care plan for preventive services as well as general preventive health recommendations were provided to patient.     Barb Merino, LPN   1/66/0630   Nurse Notes: none  Due to this being a virtual visit, the after visit summary with patients personalized plan was offered to patient via mail or my-chart.  Patient would like to access on my-chart

## 2021-10-01 NOTE — Patient Instructions (Signed)
[name] , Thank you for taking time to come for your Medicare Wellness Visit. I appreciate your ongoing commitment to your health goals. Please review the following plan we discussed and let me know if I can assist you in the future.   Screening recommendations/referrals: Colonoscopy: not required Recommended yearly ophthalmology/optometry visit for glaucoma screening and checkup Recommended yearly dental visit for hygiene and checkup  Vaccinations: Influenza vaccine: due Pneumococcal vaccine: completed 06/03/2013 Tdap vaccine: completed 12/18/2017, due 12/19/2027 Shingles vaccine: completed   Covid-19:  12/13/2019, 03/23/2019, 03/02/2019  Advanced directives: copy in chart  Conditions/risks identified: none  Next appointment: Follow up in one year for your annual wellness visit.   Preventive Care 78 Years and Older, Male Preventive care refers to lifestyle choices and visits with your health care provider that can promote health and wellness. What does preventive care include? A yearly physical exam. This is also called an annual well check. Dental exams once or twice a year. Routine eye exams. Ask your health care provider how often you should have your eyes checked. Personal lifestyle choices, including: Daily care of your teeth and gums. Regular physical activity. Eating a healthy diet. Avoiding tobacco and drug use. Limiting alcohol use. Practicing safe sex. Taking low doses of aspirin every day. Taking vitamin and mineral supplements as recommended by your health care provider. What happens during an annual well check? The services and screenings done by your health care provider during your annual well check will depend on your age, overall health, lifestyle risk factors, and family history of disease. Counseling  Your health care provider may ask you questions about your: Alcohol use. Tobacco use. Drug use. Emotional well-being. Home and relationship  well-being. Sexual activity. Eating habits. History of falls. Memory and ability to understand (cognition). Work and work Astronomer. Screening  You may have the following tests or measurements: Height, weight, and BMI. Blood pressure. Lipid and cholesterol levels. These may be checked every 5 years, or more frequently if you are over 8 years old. Skin check. Lung cancer screening. You may have this screening every year starting at age 61 if you have a 30-pack-year history of smoking and currently smoke or have quit within the past 15 years. Fecal occult blood test (FOBT) of the stool. You may have this test every year starting at age 57. Flexible sigmoidoscopy or colonoscopy. You may have a sigmoidoscopy every 5 years or a colonoscopy every 10 years starting at age 28. Prostate cancer screening. Recommendations will vary depending on your family history and other risks. Hepatitis C blood test. Hepatitis B blood test. Sexually transmitted disease (STD) testing. Diabetes screening. This is done by checking your blood sugar (glucose) after you have not eaten for a while (fasting). You may have this done every 1-3 years. Abdominal aortic aneurysm (AAA) screening. You may need this if you are a current or former smoker. Osteoporosis. You may be screened starting at age 68 if you are at high risk. Talk with your health care provider about your test results, treatment options, and if necessary, the need for more tests. Vaccines  Your health care provider may recommend certain vaccines, such as: Influenza vaccine. This is recommended every year. Tetanus, diphtheria, and acellular pertussis (Tdap, Td) vaccine. You may need a Td booster every 10 years. Zoster vaccine. You may need this after age 15. Pneumococcal 13-valent conjugate (PCV13) vaccine. One dose is recommended after age 55. Pneumococcal polysaccharide (PPSV23) vaccine. One dose is recommended after age 60. Talk to  your health care  provider about which screenings and vaccines you need and how often you need them. This information is not intended to replace advice given to you by your health care provider. Make sure you discuss any questions you have with your health care provider. Document Released: 02/27/2015 Document Revised: 10/21/2015 Document Reviewed: 12/02/2014 Elsevier Interactive Patient Education  2017 [place] Prevention in the Home Falls can cause injuries. They can happen to people of all ages. There are many things you can do to make your home safe and to help prevent falls. What can I do on the outside of my home? Regularly fix the edges of walkways and driveways and fix any cracks. Remove anything that might make you trip as you walk through a door, such as a raised step or threshold. Trim any bushes or trees on the path to your home. Use bright outdoor lighting. Clear any walking paths of anything that might make someone trip, such as rocks or tools. Regularly check to see if handrails are loose or broken. Make sure that both sides of any steps have handrails. Any raised decks and porches should have guardrails on the edges. Have any leaves, snow, or ice cleared regularly. Use sand or salt on walking paths during winter. Clean up any spills in your garage right away. This includes oil or grease spills. What can I do in the bathroom? Use night lights. Install grab bars by the toilet and in the tub and shower. Do not use towel bars as grab bars. Use non-skid mats or decals in the tub or shower. If you need to sit down in the shower, use a plastic, non-slip stool. Keep the floor dry. Clean up any water that spills on the floor as soon as it happens. Remove soap buildup in the tub or shower regularly. Attach bath mats securely with double-sided non-slip rug tape. Do not have throw rugs and other things on the floor that can make you trip. What can I do in the bedroom? Use night lights. Make  sure that you have a light by your bed that is easy to reach. Do not use any sheets or blankets that are too big for your bed. They should not hang down onto the floor. Have a firm chair that has side arms. You can use this for support while you get dressed. Do not have throw rugs and other things on the floor that can make you trip. What can I do in the kitchen? Clean up any spills right away. Avoid walking on wet floors. Keep items that you use a lot in easy-to-reach places. If you need to reach something above you, use a strong step stool that has a grab bar. Keep electrical cords out of the way. Do not use floor polish or wax that makes floors slippery. If you must use wax, use non-skid floor wax. Do not have throw rugs and other things on the floor that can make you trip. What can I do with my stairs? Do not leave any items on the stairs. Make sure that there are handrails on both sides of the stairs and use them. Fix handrails that are broken or loose. Make sure that handrails are as long as the stairways. Check any carpeting to make sure that it is firmly attached to the stairs. Fix any carpet that is loose or worn. Avoid having throw rugs at the top or bottom of the stairs. If you do have throw rugs, attach  them to the floor with carpet tape. Make sure that you have a light switch at the top of the stairs and the bottom of the stairs. If you do not have them, ask someone to add them for you. What else can I do to help prevent falls? Wear shoes that: Do not have high heels. Have rubber bottoms. Are comfortable and fit you well. Are closed at the toe. Do not wear sandals. If you use a stepladder: Make sure that it is fully opened. Do not climb a closed stepladder. Make sure that both sides of the stepladder are locked into place. Ask someone to hold it for you, if possible. Clearly mark and make sure that you can see: Any grab bars or handrails. First and last steps. Where the  edge of each step is. Use tools that help you move around (mobility aids) if they are needed. These include: Canes. Walkers. Scooters. Crutches. Turn on the lights when you go into a dark area. Replace any light bulbs as soon as they burn out. Set up your furniture so you have a clear path. Avoid moving your furniture around. If any of your floors are uneven, fix them. If there are any pets around you, be aware of where they are. Review your medicines with your doctor. Some medicines can make you feel dizzy. This can increase your chance of falling. Ask your doctor what other things that you can do to help prevent falls. This information is not intended to replace advice given to you by your health care provider. Make sure you discuss any questions you have with your health care provider. Document Released: 11/27/2008 Document Revised: 07/09/2015 Document Reviewed: 03/07/2014 Elsevier Interactive Patient Education  2017 [employer].

## 2021-10-13 ENCOUNTER — Ambulatory Visit (INDEPENDENT_AMBULATORY_CARE_PROVIDER_SITE_OTHER): Payer: Medicare PPO | Admitting: Family Medicine

## 2021-10-13 ENCOUNTER — Encounter: Payer: Self-pay | Admitting: Family Medicine

## 2021-10-13 ENCOUNTER — Telehealth: Payer: Self-pay

## 2021-10-13 VITALS — BP 126/80 | HR 64 | Temp 97.2°F | Ht 67.75 in | Wt 234.4 lb

## 2021-10-13 DIAGNOSIS — H9113 Presbycusis, bilateral: Secondary | ICD-10-CM

## 2021-10-13 DIAGNOSIS — H6120 Impacted cerumen, unspecified ear: Secondary | ICD-10-CM

## 2021-10-13 DIAGNOSIS — R3911 Hesitancy of micturition: Secondary | ICD-10-CM

## 2021-10-13 DIAGNOSIS — N1831 Chronic kidney disease, stage 3a: Secondary | ICD-10-CM

## 2021-10-13 DIAGNOSIS — R7301 Impaired fasting glucose: Secondary | ICD-10-CM | POA: Diagnosis not present

## 2021-10-13 DIAGNOSIS — E785 Hyperlipidemia, unspecified: Secondary | ICD-10-CM | POA: Diagnosis not present

## 2021-10-13 DIAGNOSIS — H6121 Impacted cerumen, right ear: Secondary | ICD-10-CM | POA: Diagnosis not present

## 2021-10-13 DIAGNOSIS — I10 Essential (primary) hypertension: Secondary | ICD-10-CM

## 2021-10-13 DIAGNOSIS — E669 Obesity, unspecified: Secondary | ICD-10-CM

## 2021-10-13 DIAGNOSIS — M1990 Unspecified osteoarthritis, unspecified site: Secondary | ICD-10-CM | POA: Diagnosis not present

## 2021-10-13 DIAGNOSIS — N529 Male erectile dysfunction, unspecified: Secondary | ICD-10-CM

## 2021-10-13 DIAGNOSIS — Z0000 Encounter for general adult medical examination without abnormal findings: Secondary | ICD-10-CM | POA: Diagnosis not present

## 2021-10-13 DIAGNOSIS — N401 Enlarged prostate with lower urinary tract symptoms: Secondary | ICD-10-CM

## 2021-10-13 LAB — POCT GLYCOSYLATED HEMOGLOBIN (HGB A1C): Hemoglobin A1C: 6.3 % — AB (ref 4.0–5.6)

## 2021-10-13 MED ORDER — ATORVASTATIN CALCIUM 40 MG PO TABS: 40.0000 mg | ORAL_TABLET | Freq: Every day | ORAL | 3 refills | Status: DC

## 2021-10-13 MED ORDER — FINASTERIDE 5 MG PO TABS: 5.0000 mg | ORAL_TABLET | Freq: Every day | ORAL | 3 refills | Status: DC

## 2021-10-13 MED ORDER — LISINOPRIL-HYDROCHLOROTHIAZIDE 10-12.5 MG PO TABS: 1.0000 | ORAL_TABLET | Freq: Every day | ORAL | 3 refills | Status: DC

## 2021-10-13 NOTE — Patient Instructions (Signed)
Health Maintenance, Male Adopting a healthy lifestyle and getting preventive care are important in promoting health and wellness. Ask your health care provider about: The right schedule for you to have regular tests and exams. Things you can do on your own to prevent diseases and keep yourself healthy. What should I know about diet, weight, and exercise? Eat a healthy diet  Eat a diet that includes plenty of vegetables, fruits, low-fat dairy products, and lean protein. Do not eat a lot of foods that are high in solid fats, added sugars, or sodium. Maintain a healthy weight Body mass index (BMI) is a measurement that can be used to identify possible weight problems. It estimates body fat based on height and weight. Your health care provider can help determine your BMI and help you achieve or maintain a healthy weight. Get regular exercise Get regular exercise. This is one of the most important things you can do for your health. Most adults should: Exercise for at least 150 minutes each week. The exercise should increase your heart rate and make you sweat (moderate-intensity exercise). Do strengthening exercises at least twice a week. This is in addition to the moderate-intensity exercise. Spend less time sitting. Even light physical activity can be beneficial. Watch cholesterol and blood lipids Have your blood tested for lipids and cholesterol at 78 years of age, then have this test every 5 years. You may need to have your cholesterol levels checked more often if: Your lipid or cholesterol levels are high. You are older than 78 years of age. You are at high risk for heart disease. What should I know about cancer screening? Many types of cancers can be detected early and may often be prevented. Depending on your health history and family history, you may need to have cancer screening at various ages. This may include screening for: Colorectal cancer. Prostate cancer. Skin cancer. Lung  cancer. What should I know about heart disease, diabetes, and high blood pressure? Blood pressure and heart disease High blood pressure causes heart disease and increases the risk of stroke. This is more likely to develop in people who have high blood pressure readings or are overweight. Talk with your health care provider about your target blood pressure readings. Have your blood pressure checked: Every 3-5 years if you are 18-39 years of age. Every year if you are 40 years old or older. If you are between the ages of 65 and 75 and are a current or former smoker, ask your health care provider if you should have a one-time screening for abdominal aortic aneurysm (AAA). Diabetes Have regular diabetes screenings. This checks your fasting blood sugar level. Have the screening done: Once every three years after age 45 if you are at a normal weight and have a low risk for diabetes. More often and at a younger age if you are overweight or have a high risk for diabetes. What should I know about preventing infection? Hepatitis B If you have a higher risk for hepatitis B, you should be screened for this virus. Talk with your health care provider to find out if you are at risk for hepatitis B infection. Hepatitis C Blood testing is recommended for: Everyone born from 1945 through 1965. Anyone with known risk factors for hepatitis C. Sexually transmitted infections (STIs) You should be screened each year for STIs, including gonorrhea and chlamydia, if: You are sexually active and are younger than 78 years of age. You are older than 78 years of age and your   health care provider tells you that you are at risk for this type of infection. Your sexual activity has changed since you were last screened, and you are at increased risk for chlamydia or gonorrhea. Ask your health care provider if you are at risk. Ask your health care provider about whether you are at high risk for HIV. Your health care provider  may recommend a prescription medicine to help prevent HIV infection. If you choose to take medicine to prevent HIV, you should first get tested for HIV. You should then be tested every 3 months for as long as you are taking the medicine. Follow these instructions at home: Alcohol use Do not drink alcohol if your health care provider tells you not to drink. If you drink alcohol: Limit how much you have to 0-2 drinks a day. Know how much alcohol is in your drink. In the [place] one drink equals one 12 oz bottle of beer (355 mL), one 5 oz glass of wine (148 mL), or one 1 oz glass of hard liquor (44 mL). Lifestyle Do not use any products that contain nicotine or tobacco. These products include cigarettes, chewing tobacco, and vaping devices, such as e-cigarettes. If you need help quitting, ask your health care provider. Do not use street drugs. Do not share needles. Ask your health care provider for help if you need support or information about quitting drugs. General instructions Schedule regular health, dental, and eye exams. Stay current with your vaccines. Tell your health care provider if: You often feel depressed. You have ever been abused or do not feel safe at home. Summary Adopting a healthy lifestyle and getting preventive care are important in promoting health and wellness. Follow your health care provider's instructions about healthy diet, exercising, and getting tested or screened for diseases. Follow your health care provider's instructions on monitoring your cholesterol and blood pressure. This information is not intended to replace advice given to you by your health care provider. Make sure you discuss any questions you have with your health care provider. Document Revised: 06/22/2020 Document Reviewed: 06/22/2020 Elsevier Patient Education  2023 [employer]vier Inc.  

## 2021-10-13 NOTE — Progress Notes (Signed)
Complete physical exam  Patient: Stephen Padilla   DOB: Jun 27, 1945   76 y.o. Male  MRN: [id number]  Subjective:    Chief Complaint  Patient presents with   Annual Exam    Awv done fasting     Stephen Padilla is a 76 y.o. male who presents today for a complete physical exam. He reports consuming a general diet. Staying active at least 20 minutes a day. He generally feels well. He reports sleeping well. He does not have additional problems to discuss today.  He continues on lisinopril/HCTZ and is having no difficulty with that.  Lipitor is causing no aches or pain.  He does use Cialis on an as-needed basis.  Continues on finasteride which is helping his bladder related symptoms.  Does have a previous history of glucose intolerance but is not made any major lifestyle changes in terms of diet and exercise.  He is now fully retired and enjoying his retirement.  His marriage is going well.  He is considering starting an exercise program.  He does note some difficulty with noise and his wife says that he turns the TV up too loud.   Most recent fall risk assessment:    10/01/2021   10:17 AM  Fall Risk   Falls in the past year? 0  Number falls in past yr: 0  Injury with Fall? 0  Risk for fall due to : Medication side effect  Follow up Falls evaluation completed;Education provided;Falls prevention discussed     Most recent depression screenings:    10/01/2021   10:17 AM 09/08/2020   10:42 AM  PHQ 2/9 Scores  PHQ - 2 Score 0 0  PHQ- 9 Score 0       Patient Active Problem List   Diagnosis Date Noted   Benign prostatic hyperplasia with urinary hesitancy 03/12/2020   Erectile dysfunction 03/12/2020   Arthritis 06/17/2019   Hypertension 12/10/2010   Hyperlipidemia with target LDL less than 100 12/10/2010   Obesity (BMI 30-39.9) 12/10/2010   Past Medical History:  Diagnosis Date   Allergy    RHINITIS   Alopecia    Arthritis    Cataract    Hemorrhoids    Hypercholesterolemia     Hypertension    Obesity    Past Surgical History:  Procedure Laterality Date   EYE SURGERY  2011   BILAT CATARACTS   Social History   Tobacco Use   Smoking status: Never   Smokeless tobacco: Never  Vaping Use   Vaping Use: Never used  Substance Use Topics   Alcohol use: Yes    Comment: occasionally, maybe 1-2 drinks socially (usually once a month)   Drug use: No   Family History  Problem Relation Age of Onset   Hypertension Mother    Heart disease Sister    Colon cancer Neg Hx    Esophageal cancer Neg Hx    Rectal cancer Neg Hx    Stomach cancer Neg Hx    Allergies  Allergen Reactions   Penicillins     REACTION: rash      Patient Care Team: Denita Lung, MD as PCP - General (Family Medicine)   Outpatient Medications Prior to Visit  Medication Sig Note   aspirin EC 81 MG tablet Take 81 mg by mouth daily. Takes 1 dailt    atorvastatin (LIPITOR) 40 MG tablet Take 1 tablet (40 mg total) by mouth daily.    finasteride (PROSCAR) 5 MG tablet Take 1  tablet (5 mg total) by mouth daily.    glucosamine-chondroitin 500-400 MG tablet Take 1 tablet by mouth 3 (three) times daily.    lisinopril-hydrochlorothiazide (ZESTORETIC) 10-12.5 MG tablet TAKE 1 TABLET BY MOUTH EVERY DAY    Multiple Vitamin (MULTIVITAMIN) capsule Take 1 capsule by mouth daily.    Multiple Vitamins-Minerals (EYE VITAMINS PO) Take 1 tablet by mouth daily.    tadalafil (CIALIS) 20 MG tablet Take 1 tablet (20 mg total) by mouth daily as needed for erectile dysfunction. 10/13/2021: Prn last dose two weeks ago   Turmeric (QC TUMERIC COMPLEX PO) Take by mouth.    Ascorbic Acid (VITAMIN C) 100 MG tablet  (Patient not taking: Reported on 09/08/2020)    No facility-administered medications prior to visit.    Review of Systems  All other systems reviewed and are negative.         Objective:     BP 126/80   Pulse 64   Temp (!) 97.2 F (36.2 C)   Ht 5' 7.75" (1.721 m)   Wt 234 lb 6.4 oz (106.3 kg)    SpO2 94%   BMI 35.90 kg/m  BP Readings from Last 3 Encounters:  10/13/21 126/80  09/08/20 108/68  08/18/20 110/76   Wt Readings from Last 3 Encounters:  10/13/21 234 lb 6.4 oz (106.3 kg)  10/01/21 225 lb (102.1 kg)  09/08/20 230 lb 6.4 oz (104.5 kg)      Physical Exam  Alert and in no distress. Tympanic membranes and canals are normal after cerumen was removed from the right canal.. Pharyngeal area is normal. Neck is supple without adenopathy or thyromegaly. Cardiac exam shows a regular sinus rhythm without murmurs or gallops. Lungs are clear to auscultation. Hemoglobin A1c is 6.3. Last CBC Lab Results  Component Value Date   WBC 7.3 09/08/2020   HGB 15.0 09/08/2020   HCT 47.2 09/08/2020   MCV 88 09/08/2020   MCH 28.1 09/08/2020   RDW 14.1 09/08/2020   PLT 238 88/89/1694   Last metabolic panel Lab Results  Component Value Date   GLUCOSE 142 (H) 09/08/2020   NA 139 09/08/2020   K 4.1 09/08/2020   CL 98 09/08/2020   CO2 23 09/08/2020   BUN 28 (H) 09/08/2020   CREATININE 1.75 (H) 09/08/2020   EGFR 40 (L) 09/08/2020   CALCIUM 9.1 09/08/2020   PROT 7.1 09/08/2020   ALBUMIN 4.2 09/08/2020   LABGLOB 2.9 09/08/2020   AGRATIO 1.4 09/08/2020   BILITOT 0.5 09/08/2020   ALKPHOS 94 09/08/2020   AST 30 09/08/2020   ALT 27 09/08/2020   ANIONGAP 7 09/06/2016   Last lipids Lab Results  Component Value Date   CHOL 252 (H) 09/08/2020   HDL 44 09/08/2020   LDLCALC 175 (H) 09/08/2020   TRIG 176 (H) 09/08/2020   CHOLHDL 5.7 (H) 09/08/2020   Last hemoglobin A1c Lab Results  Component Value Date   HGBA1C 6.4 (H) 09/08/2020        Assessment & Plan:    Primary hypertension - Plan: CBC with Differential/Platelet, Comprehensive metabolic panel  Arthritis  Benign prostatic hyperplasia with urinary hesitancy - Plan: finasteride (PROSCAR) 5 MG tablet  Erectile dysfunction, unspecified erectile dysfunction type  Hyperlipidemia with target LDL less than 100 - Plan:  atorvastatin (LIPITOR) 40 MG tablet, Lipid panel  Obesity (BMI 30-39.9)  Impaired fasting glucose - Plan: POCT glycosylated hemoglobin (Hb A1C)  Essential hypertension - Plan: lisinopril-hydrochlorothiazide (ZESTORETIC) 10-12.5 MG tablet  Routine general medical examination at  a health care facility  Stage 3a chronic kidney disease ([place])  Immunization History  Administered Date(s) Administered   Fluad Quad(high Dose 65+) 12/13/2019   Influenza Split 12/10/2010, 01/23/2012   Influenza, High Dose Seasonal PF 02/04/2015, 01/26/2017, 12/18/2017   Influenza-Unspecified 01/16/2016, 12/18/2017   PFIZER(Purple Top)SARS-COV-2 Vaccination 03/02/2019, 03/23/2019, 12/13/2019   PPD Test 01/04/1980   Pneumococcal Conjugate-13 06/03/2013   Pneumococcal-Unspecified 09/04/2007   Td 12/18/2017   Tdap 09/04/2007, 12/18/2017   Zoster Recombinat (Shingrix) 01/31/2018, 04/06/2018   Zoster, Live 12/17/2010    Health Maintenance  Topic Date Due   COVID-19 Vaccine (4 - Pfizer series) 02/07/2020   INFLUENZA VACCINE  09/14/2021   Pneumonia Vaccine 37+ Years old (2 - PPSV23 or PCV20) 10/14/2022 (Originally 06/04/2014)   TETANUS/TDAP  12/19/2027   Hepatitis C Screening  Completed   Zoster Vaccines- Shingrix  Completed   HPV VACCINES  Aged Out   COLONOSCOPY (Pts 45-15yr Insurance coverage will need to be confirmed)  Discontinued  Recommend he stop taking his baby aspirin.  Encouraged him to get involved in a regular exercise program.  Continue present medications.  Discussed health benefits of physical activity, and encouraged him to engage in regular exercise appropriate for his age and condition.  Problem List Items Addressed This Visit     Arthritis   Benign prostatic hyperplasia with urinary hesitancy   Erectile dysfunction   Hyperlipidemia with target LDL less than 100   Hypertension - Primary   Obesity (BMI 30-39.9)   Other Visit Diagnoses     Impaired fasting glucose       Relevant  Orders   POCT glycosylated hemoglobin (Hb A1C)      Return in about 1 year (around 10/14/2022) for cpe and awv.     JJill Alexanders MD

## 2021-10-13 NOTE — Telephone Encounter (Signed)
Called pt to advise per [facility] he will be sending a referral into audiology for pt hearing . K[facility]

## 2021-10-14 LAB — COMPREHENSIVE METABOLIC PANEL
ALT: 20 IU/L (ref 0–44)
AST: 25 IU/L (ref 0–40)
Albumin/Globulin Ratio: 1.5 (ref 1.2–2.2)
Albumin: 4.1 g/dL (ref 3.8–4.8)
Alkaline Phosphatase: 115 IU/L (ref 44–121)
BUN/Creatinine Ratio: 14 (ref 10–24)
BUN: 23 mg/dL (ref 8–27)
Bilirubin Total: 0.5 mg/dL (ref 0.0–1.2)
CO2: 22 mmol/L (ref 20–29)
Calcium: 10.2 mg/dL (ref 8.6–10.2)
Chloride: 102 mmol/L (ref 96–106)
Creatinine, Ser: 1.63 mg/dL — ABNORMAL HIGH (ref 0.76–1.27)
Globulin, Total: 2.8 g/dL (ref 1.5–4.5)
Glucose: 77 mg/dL (ref 70–99)
Potassium: 4.8 mmol/L (ref 3.5–5.2)
Sodium: 141 mmol/L (ref 134–144)
Total Protein: 6.9 g/dL (ref 6.0–8.5)
eGFR: 43 mL/min/{1.73_m2} — ABNORMAL LOW (ref >59)

## 2021-10-14 LAB — CBC WITH DIFFERENTIAL/PLATELET
Basophils Absolute: 0.1 10*3/uL (ref 0.0–0.2)
Basos: 1 %
EOS (ABSOLUTE): 0.3 10*3/uL (ref 0.0–0.4)
Eos: 5 %
Hematocrit: 42.8 % (ref 37.5–51.0)
Hemoglobin: 14.4 g/dL (ref 13.0–17.7)
Immature Grans (Abs): 0 10*3/uL (ref 0.0–0.1)
Immature Granulocytes: 0 %
Lymphocytes Absolute: 1.7 10*3/uL (ref 0.7–3.1)
Lymphs: 25 %
MCH: 29.5 pg (ref 26.6–33.0)
MCHC: 33.6 g/dL (ref 31.5–35.7)
MCV: 88 fL (ref 79–97)
Monocytes Absolute: 0.6 10*3/uL (ref 0.1–0.9)
Monocytes: 10 %
Neutrophils Absolute: 3.9 10*3/uL (ref 1.4–7.0)
Neutrophils: 59 %
Platelets: 195 10*3/uL (ref 150–450)
RBC: 4.88 x10E6/uL (ref 4.14–5.80)
RDW: 14 % (ref 11.6–15.4)
WBC: 6.6 10*3/uL (ref 3.4–10.8)

## 2021-10-14 LAB — LIPID PANEL
Chol/HDL Ratio: 4 ratio (ref 0.0–5.0)
Cholesterol, Total: 195 mg/dL (ref 100–199)
HDL: 49 mg/dL (ref >39)
LDL Chol Calc (NIH): 126 mg/dL — ABNORMAL HIGH (ref 0–99)
Triglycerides: 114 mg/dL (ref 0–149)
VLDL Cholesterol Cal: 20 mg/dL (ref 5–40)

## 2021-10-20 ENCOUNTER — Encounter: Payer: Self-pay | Admitting: Internal Medicine

## 2021-11-23 ENCOUNTER — Encounter: Payer: Self-pay | Admitting: Internal Medicine

## 2021-12-06 ENCOUNTER — Encounter: Payer: Self-pay | Admitting: Internal Medicine

## 2022-01-10 ENCOUNTER — Other Ambulatory Visit: Payer: Self-pay | Admitting: Family Medicine

## 2022-01-10 ENCOUNTER — Telehealth: Payer: Self-pay | Admitting: Family Medicine

## 2022-01-10 DIAGNOSIS — E785 Hyperlipidemia, unspecified: Secondary | ICD-10-CM

## 2022-01-10 DIAGNOSIS — N529 Male erectile dysfunction, unspecified: Secondary | ICD-10-CM

## 2022-01-10 MED ORDER — TADALAFIL 20 MG PO TABS: 20.0000 mg | ORAL_TABLET | Freq: Every day | ORAL | 5 refills | Status: AC | PRN

## 2022-01-10 NOTE — Telephone Encounter (Signed)
Pt called for refills of cialis. Please send to [facility] in [place]

## 2022-01-11 ENCOUNTER — Telehealth: Payer: Self-pay | Admitting: Family Medicine

## 2022-01-11 NOTE — Telephone Encounter (Signed)
Pt left message that Tadalafil not at pharmacy,  [facility] & spoke with pharmacist & they have Rx bagged & ready for pick up, left message for pt

## 2022-02-21 ENCOUNTER — Encounter: Payer: Self-pay | Admitting: Nurse Practitioner

## 2022-02-21 ENCOUNTER — Telehealth: Payer: Medicare PPO | Admitting: Nurse Practitioner

## 2022-02-21 VITALS — Ht 69.0 in | Wt 230.0 lb

## 2022-02-21 DIAGNOSIS — B9689 Other specified bacterial agents as the cause of diseases classified elsewhere: Secondary | ICD-10-CM | POA: Diagnosis not present

## 2022-02-21 DIAGNOSIS — J069 Acute upper respiratory infection, unspecified: Secondary | ICD-10-CM | POA: Insufficient documentation

## 2022-02-21 MED ORDER — ALBUTEROL SULFATE HFA 108 (90 BASE) MCG/ACT IN AERS: 2.0000 | INHALATION_SPRAY | Freq: Four times a day (QID) | RESPIRATORY_TRACT | 3 refills | Status: AC | PRN

## 2022-02-21 MED ORDER — PREDNISONE 20 MG PO TABS: 40.0000 mg | ORAL_TABLET | Freq: Every day | ORAL | 0 refills | Status: DC

## 2022-02-21 MED ORDER — AZITHROMYCIN 250 MG PO TABS: ORAL_TABLET | ORAL | 0 refills | Status: AC

## 2022-02-21 NOTE — Assessment & Plan Note (Signed)
Onset of symptoms 6 days ago with progressively worsening course and purulent mucous production with wheezing present. I feel at this point we need to start antibiotic treatment for possible secondary bacterial infection and steroid to reduce inflammation in the lungs. Will also send albuterol inhaler. Recommendations for supportive care provided. If he has no improvement in the next 2-3 days I have asked him to let us know. At that point we can consider CXR.

## 2022-02-21 NOTE — Progress Notes (Signed)
Virtual Visit Encounter video and audio visit.   I connected with  Stephen Padilla on 02/21/22 at 12:00 PM EST by secure video and audio telemedicine application. I verified that I am speaking with the correct person using two identifiers.   I introduced myself as a Designer, jewellery with the practice. The limitations of evaluation and management by telemedicine discussed with the patient and the availability of in person appointments. The patient expressed verbal understanding and consent to proceed.  Participating parties in this visit include: Myself and patient  The patient is: Patient Location: Home I am: Provider Location: Office/Clinic Subjective:    CC and HPI: Stephen Padilla is a 77 y.o. year old male presenting for new evaluation and treatment of cough/congestion. Patient reports the following: He tells me he started with symptoms last Wednesday with cough and congestion. His symptoms have progressively worsened and he tells me that when he woke up this morning he was unable to stop coughing. He tells me he is wheezing at night and he is experiencing shortness of breath. He was taking over the counter alkaseltzer plus, but this was not helpful so he switched to mucinex. He does not feel that either are helping. His wife is a Marine scientist and she listened to breath sounds and reports wheezing present. He tells me the congestion is primarily in his lungs and he has been coughing up purulent mucous.   Past medical history, Surgical history, Family history not pertinant except as noted below, Social history, Allergies, and medications have been entered into the medical record, reviewed, and corrections made.   Review of Systems:  All review of systems negative except what is listed in the HPI  Objective:    Alert and oriented x 4 Cough and audible wheezing present Speaking in clear sentences. No distress noted  Impression and Recommendations:    Problem List Items Addressed This  Visit     Bacterial upper respiratory infection - Primary    Onset of symptoms 6 days ago with progressively worsening course and purulent mucous production with wheezing present. I feel at this point we need to start antibiotic treatment for possible secondary bacterial infection and steroid to reduce inflammation in the lungs. Will also send albuterol inhaler. Recommendations for supportive care provided. If he has no improvement in the next 2-3 days I have asked him to let us know. At that point we can consider CXR.       Relevant Medications   azithromycin (ZITHROMAX) 250 MG tablet   predniSONE (DELTASONE) 20 MG tablet   albuterol (VENTOLIN HFA) 108 (90 Base) MCG/ACT inhaler    orders and follow up as documented in EMR I discussed the assessment and treatment plan with the patient. The patient was provided an opportunity to ask questions and all were answered. The patient agreed with the plan and demonstrated an understanding of the instructions.   The patient was advised to call back or seek an in-person evaluation if the symptoms worsen or if the condition fails to improve as anticipated.  Follow-Up: prn  I provided 22 minutes of non-face-to-face interaction with this non face-to-face encounter including intake, same-day documentation, and chart review.   Orma Render, NP , DNP, AGNP-c [facility]

## 2022-03-03 DIAGNOSIS — H348322 Tributary (branch) retinal vein occlusion, left eye, stable: Secondary | ICD-10-CM | POA: Diagnosis not present

## 2022-03-03 DIAGNOSIS — H26493 Other secondary cataract, bilateral: Secondary | ICD-10-CM | POA: Diagnosis not present

## 2022-03-03 DIAGNOSIS — H04123 Dry eye syndrome of bilateral lacrimal glands: Secondary | ICD-10-CM | POA: Diagnosis not present

## 2022-03-03 DIAGNOSIS — H353131 Nonexudative age-related macular degeneration, bilateral, early dry stage: Secondary | ICD-10-CM | POA: Diagnosis not present

## 2022-04-18 DIAGNOSIS — H903 Sensorineural hearing loss, bilateral: Secondary | ICD-10-CM | POA: Diagnosis not present

## 2022-08-01 ENCOUNTER — Other Ambulatory Visit: Payer: Self-pay | Admitting: Family Medicine

## 2022-08-01 DIAGNOSIS — E785 Hyperlipidemia, unspecified: Secondary | ICD-10-CM

## 2022-10-04 ENCOUNTER — Ambulatory Visit (INDEPENDENT_AMBULATORY_CARE_PROVIDER_SITE_OTHER): Payer: Medicare PPO

## 2022-10-04 VITALS — BP 120/70 | HR 74 | Temp 97.7°F | Ht 69.0 in | Wt 226.8 lb

## 2022-10-04 DIAGNOSIS — Z0000 Encounter for general adult medical examination without abnormal findings: Secondary | ICD-10-CM

## 2022-10-04 NOTE — Patient Instructions (Signed)
Stephen Padilla , Thank you for taking time to come for your Medicare Wellness Visit. I appreciate your ongoing commitment to your health goals. Please review the following plan we discussed and let me know if I can assist you in the future.   Referrals/Orders/Follow-Ups/Clinician Recommendations: none  This is a list of the screening recommended for you and due dates:  Health Maintenance  Topic Date Due   COVID-19 Vaccine (4 - 2023-24 season) 10/15/2021   Flu Shot  09/15/2022   Pneumonia Vaccine (2 of 2 - PPSV23 or PCV20) 10/14/2022*   Medicare Annual Wellness Visit  10/04/2023   DTaP/Tdap/Td vaccine (4 - Td or Tdap) 12/19/2027   Hepatitis C Screening  Completed   Zoster (Shingles) Vaccine  Completed   HPV Vaccine  Aged Out   Colon Cancer Screening  Discontinued  *Topic was postponed. The date shown is not the original due date.    Advanced directives: (In Chart) A copy of your advanced directives are scanned into your chart should your provider ever need it.  Next Medicare Annual Wellness Visit scheduled for next year: Yes  Preventive Care 100 Years and Older, Male  Preventive care refers to lifestyle choices and visits with your health care provider that can promote health and wellness. What does preventive care include? A yearly physical exam. This is also called an annual well check. Dental exams once or twice a year. Routine eye exams. Ask your health care provider how often you should have your eyes checked. Personal lifestyle choices, including: Daily care of your teeth and gums. Regular physical activity. Eating a healthy diet. Avoiding tobacco and drug use. Limiting alcohol use. Practicing safe sex. Taking low doses of aspirin every day. Taking vitamin and mineral supplements as recommended by your health care provider. What happens during an annual well check? The services and screenings done by your health care provider during your annual well check will depend on your  age, overall health, lifestyle risk factors, and family history of disease. Counseling  Your health care provider may ask you questions about your: Alcohol use. Tobacco use. Drug use. Emotional well-being. Home and relationship well-being. Sexual activity. Eating habits. History of falls. Memory and ability to understand (cognition). Work and work Astronomer. Screening  You may have the following tests or measurements: Height, weight, and BMI. Blood pressure. Lipid and cholesterol levels. These may be checked every 5 years, or more frequently if you are over 3 years old. Skin check. Lung cancer screening. You may have this screening every year starting at age 29 if you have a 30-pack-year history of smoking and currently smoke or have quit within the past 15 years. Fecal occult blood test (FOBT) of the stool. You may have this test every year starting at age 2. Flexible sigmoidoscopy or colonoscopy. You may have a sigmoidoscopy every 5 years or a colonoscopy every 10 years starting at age 34. Prostate cancer screening. Recommendations will vary depending on your family history and other risks. Hepatitis C blood test. Hepatitis B blood test. Sexually transmitted disease (STD) testing. Diabetes screening. This is done by checking your blood sugar (glucose) after you have not eaten for a while (fasting). You may have this done every 1-3 years. Abdominal aortic aneurysm (AAA) screening. You may need this if you are a current or former smoker. Osteoporosis. You may be screened starting at age 66 if you are at high risk. Talk with your health care provider about your test results, treatment options, and if necessary,  the need for more tests. Vaccines  Your health care provider may recommend certain vaccines, such as: Influenza vaccine. This is recommended every year. Tetanus, diphtheria, and acellular pertussis (Tdap, Td) vaccine. You may need a Td booster every 10 years. Zoster  vaccine. You may need this after age 56. Pneumococcal 13-valent conjugate (PCV13) vaccine. One dose is recommended after age 38. Pneumococcal polysaccharide (PPSV23) vaccine. One dose is recommended after age 63. Talk to your health care provider about which screenings and vaccines you need and how often you need them. This information is not intended to replace advice given to you by your health care provider. Make sure you discuss any questions you have with your health care provider. Document Released: 02/27/2015 Document Revised: 10/21/2015 Document Reviewed: 12/02/2014 Elsevier Interactive Patient Education  2017 [employer].  Fall Prevention in the Home Falls can cause injuries. They can happen to people of all ages. There are many things you can do to make your home safe and to help prevent falls. What can I do on the outside of my home? Regularly fix the edges of walkways and driveways and fix any cracks. Remove anything that might make you trip as you walk through a door, such as a raised step or threshold. Trim any bushes or trees on the path to your home. Use bright outdoor lighting. Clear any walking paths of anything that might make someone trip, such as rocks or tools. Regularly check to see if handrails are loose or broken. Make sure that both sides of any steps have handrails. Any raised decks and porches should have guardrails on the edges. Have any leaves, snow, or ice cleared regularly. Use sand or salt on walking paths during winter. Clean up any spills in your garage right away. This includes oil or grease spills. What can I do in the bathroom? Use night lights. Install grab bars by the toilet and in the tub and shower. Do not use towel bars as grab bars. Use non-skid mats or decals in the tub or shower. If you need to sit down in the shower, use a plastic, non-slip stool. Keep the floor dry. Clean up any water that spills on the floor as soon as it happens. Remove  soap buildup in the tub or shower regularly. Attach bath mats securely with double-sided non-slip rug tape. Do not have throw rugs and other things on the floor that can make you trip. What can I do in the bedroom? Use night lights. Make sure that you have a light by your bed that is easy to reach. Do not use any sheets or blankets that are too big for your bed. They should not hang down onto the floor. Have a firm chair that has side arms. You can use this for support while you get dressed. Do not have throw rugs and other things on the floor that can make you trip. What can I do in the kitchen? Clean up any spills right away. Avoid walking on wet floors. Keep items that you use a lot in easy-to-reach places. If you need to reach something above you, use a strong step stool that has a grab bar. Keep electrical cords out of the way. Do not use floor polish or wax that makes floors slippery. If you must use wax, use non-skid floor wax. Do not have throw rugs and other things on the floor that can make you trip. What can I do with my stairs? Do not leave any items on  the stairs. Make sure that there are handrails on both sides of the stairs and use them. Fix handrails that are broken or loose. Make sure that handrails are as long as the stairways. Check any carpeting to make sure that it is firmly attached to the stairs. Fix any carpet that is loose or worn. Avoid having throw rugs at the top or bottom of the stairs. If you do have throw rugs, attach them to the floor with carpet tape. Make sure that you have a light switch at the top of the stairs and the bottom of the stairs. If you do not have them, ask someone to add them for you. What else can I do to help prevent falls? Wear shoes that: Do not have high heels. Have rubber bottoms. Are comfortable and fit you well. Are closed at the toe. Do not wear sandals. If you use a stepladder: Make sure that it is fully opened. Do not climb a  closed stepladder. Make sure that both sides of the stepladder are locked into place. Ask someone to hold it for you, if possible. Clearly mark and make sure that you can see: Any grab bars or handrails. First and last steps. Where the edge of each step is. Use tools that help you move around (mobility aids) if they are needed. These include: Canes. Walkers. Scooters. Crutches. Turn on the lights when you go into a dark area. Replace any light bulbs as soon as they burn out. Set up your furniture so you have a clear path. Avoid moving your furniture around. If any of your floors are uneven, fix them. If there are any pets around you, be aware of where they are. Review your medicines with your doctor. Some medicines can make you feel dizzy. This can increase your chance of falling. Ask your doctor what other things that you can do to help prevent falls. This information is not intended to replace advice given to you by your health care provider. Make sure you discuss any questions you have with your health care provider. Document Released: 11/27/2008 Document Revised: 07/09/2015 Document Reviewed: 03/07/2014 Elsevier Interactive Patient Education  2017 [employer].

## 2022-10-04 NOTE — Progress Notes (Signed)
Subjective:   Stephen Padilla is a 77 y.o. male who presents for Medicare Annual/Subsequent preventive examination.  Visit Complete: In person    Review of Systems     Cardiac Risk Factors include: advanced age (>36men, >31 women);dyslipidemia;hypertension;male gender;obesity (BMI >30kg/m2)     Objective:    Today's Vitals   10/04/22 1050  BP: 120/70  Pulse: 74  Temp: 97.7 F (36.5 C)  TempSrc: Oral  SpO2: 98%  Weight: 226 lb 12.8 oz (102.9 kg)  Height: 5\' 9"  (1.753 m)   Body mass index is 33.49 kg/m.     10/04/2022   10:56 AM 10/01/2021   10:15 AM 09/08/2020   10:41 AM 09/05/2019   10:47 AM 06/12/2018   10:55 AM 05/04/2017    9:52 AM 09/06/2016   11:10 AM  Advanced Directives  Does Patient Have a Medical Advance Directive? Yes Yes Yes Yes Yes Yes No  Type of Estate agent of [place];Living will Healthcare Power of [place];Living will Living will;Healthcare Power of Attorney Living will Healthcare Power of [place];Living will    Does patient want to make changes to medical advance directive?   No - Patient declined Yes (ED - Information included in AVS) No - Patient declined No - Patient declined   Copy of Healthcare Power of Attorney in Chart? Yes - validated most recent copy scanned in chart (See row information) Yes - validated most recent copy scanned in chart (See row information) Yes - validated most recent copy scanned in chart (See row information)  No - copy requested      Current Medications (verified) Outpatient Encounter Medications as of 10/04/2022  Medication Sig   albuterol (VENTOLIN HFA) 108 (90 Base) MCG/ACT inhaler Inhale 2 puffs into the lungs every 6 (six) hours as needed for wheezing or shortness of breath.   Ascorbic Acid (VITAMIN C) 100 MG tablet    atorvastatin (LIPITOR) 40 MG tablet Take 1 tablet by mouth once daily   finasteride (PROSCAR) 5 MG tablet Take 1 tablet (5 mg total) by mouth daily.   glucosamine-chondroitin  500-400 MG tablet Take 1 tablet by mouth 3 (three) times daily.   lisinopril-hydrochlorothiazide (ZESTORETIC) 10-12.5 MG tablet Take 1 tablet by mouth daily.   Multiple Vitamin (MULTIVITAMIN) capsule Take 1 capsule by mouth daily.   Multiple Vitamins-Minerals (EYE VITAMINS PO) Take 1 tablet by mouth daily.   predniSONE (DELTASONE) 20 MG tablet Take 2 tablets (40 mg total) by mouth daily with breakfast.   tadalafil (CIALIS) 20 MG tablet Take 1 tablet (20 mg total) by mouth daily as needed for erectile dysfunction.   Turmeric (QC TUMERIC COMPLEX PO) Take by mouth.   aspirin EC 81 MG tablet Take 81 mg by mouth daily. Takes 1 dailt (Patient not taking: Reported on 10/04/2022)   No facility-administered encounter medications on file as of 10/04/2022.    Allergies (verified) Penicillins   History: Past Medical History:  Diagnosis Date   Allergy    RHINITIS   Alopecia    Arthritis    Cataract    Hemorrhoids    Hypercholesterolemia    Hypertension    Obesity    Past Surgical History:  Procedure Laterality Date   EYE SURGERY  2011   BILAT CATARACTS   Family History  Problem Relation Age of Onset   Hypertension Mother    Heart disease Sister    Colon cancer Neg Hx    Esophageal cancer Neg Hx    Rectal cancer Neg  Hx    Stomach cancer Neg Hx    Social History   Socioeconomic History   Marital status: Married    Spouse name: Not on file   Number of children: Not on file   Years of education: Not on file   Highest education level: Not on file  Occupational History   Not on file  Tobacco Use   Smoking status: Never   Smokeless tobacco: Never  Vaping Use   Vaping status: Never Used  Substance and Sexual Activity   Alcohol use: Yes    Comment: occasionally, maybe 1-2 drinks socially (usually once a month)   Drug use: No   Sexual activity: Yes  Other Topics Concern   Not on file  Social History Narrative   Not on file   Social Determinants of Health   Financial  Resource Strain: Low Risk  (10/04/2022)   Overall Financial Resource Strain (CARDIA)    Difficulty of Paying Living Expenses: Not hard at all  Food Insecurity: No Food Insecurity (10/04/2022)   Hunger Vital Sign    Worried About Running Out of Food in the Last Year: Never true    Ran Out of Food in the Last Year: Never true  Transportation Needs: No Transportation Needs (10/04/2022)   PRAPARE - Administrator, Civil Service (Medical): No    Lack of Transportation (Non-Medical): No  Physical Activity: Inactive (10/04/2022)   Exercise Vital Sign    Days of Exercise per Week: 0 days    Minutes of Exercise per Session: 0 min  Stress: No Stress Concern Present (10/04/2022)   [employer] of Occupational Health - Occupational Stress Questionnaire    Feeling of Stress : Not at all  Social Connections: Socially Integrated (10/04/2022)   Social Connection and Isolation Panel [NHANES]    Frequency of Communication with Friends and Family: More than three times a week    Frequency of Social Gatherings with Friends and Family: Twice a week    Attends Religious Services: More than 4 times per year    Active Member of [employer] or Organizations: Yes    Attends Engineer, structural: More than 4 times per year    Marital Status: Married    Tobacco Counseling Counseling given: Not Answered   Clinical Intake:  Pre-visit preparation completed: Yes  Pain : No/denies pain     Nutritional Status: BMI > 30  Obese Nutritional Risks: None Diabetes: No  How often do you need to have someone help you when you read instructions, pamphlets, or other written materials from your doctor or pharmacy?: 1 - Never  Interpreter Needed?: No  Information entered by :: NAllen LPN   Activities of Daily Living    10/04/2022   10:51 AM  In your present state of health, do you have any difficulty performing the following activities:  Hearing? 0  Vision? 0  Difficulty concentrating or  making decisions? 0  Walking or climbing stairs? 0  Dressing or bathing? 0  Doing errands, shopping? 0  Preparing Food and eating ? N  Using the Toilet? N  In the past six months, have you accidently leaked urine? N  Do you have problems with loss of bowel control? N  Managing your Medications? N  Managing your Finances? N  Housekeeping or managing your Housekeeping? N    Patient Care Team: Ronnald Nian, MD as PCP - General (Family Medicine)  Indicate any recent Medical Services you may have received from other  than [facility] providers in the past year (date may be approximate).     Assessment:   This is a routine wellness examination for [place].  Hearing/Vision screen Hearing Screening - Comments:: Denies hearing issues Vision Screening - Comments:: Regular eye exams, [facility]  Dietary issues and exercise activities discussed:     Goals Addressed             This Visit's Progress    Patient Stated       10/04/2022, wants to lose weight       Depression Screen    10/04/2022   11:01 AM 10/01/2021   10:17 AM 09/08/2020   10:42 AM 09/05/2019   10:50 AM 06/12/2018    9:14 AM 05/04/2017    9:32 AM 04/19/2016    8:53 AM  PHQ 2/9 Scores  PHQ - 2 Score 0 0 0 0 0 0 0  PHQ- 9 Score 0 0         Fall Risk    10/04/2022   10:57 AM 10/01/2021   10:17 AM 09/08/2020   10:42 AM 09/05/2019   10:49 AM 06/12/2018    9:13 AM  Fall Risk   Falls in the past year? 0 0 0 0 0  Number falls in past yr: 0 0 0    Injury with Fall? 0 0 0    Risk for fall due to : Medication side effect Medication side effect No Fall Risks No Fall Risks   Follow up Falls prevention discussed;Falls evaluation completed Falls evaluation completed;Education provided;Falls prevention discussed Falls evaluation completed      MEDICARE RISK AT HOME: Medicare Risk at Home Any stairs in or around the home?: Yes If so, are there any without handrails?: No Home free of loose throw rugs in walkways, pet beds,  electrical cords, etc?: Yes Adequate lighting in your home to reduce risk of falls?: Yes Life alert?: No Use of a cane, walker or w/c?: No Grab bars in the bathroom?: No Shower chair or bench in shower?: Yes Elevated toilet seat or a handicapped toilet?: Yes  TIMED UP AND GO:  Was the test performed?  Yes  Length of time to ambulate 10 feet: 5 sec Gait steady and fast without use of assistive device    Cognitive Function:        10/04/2022   11:01 AM 10/01/2021   10:20 AM  6CIT Screen  What Year? 0 points 0 points  What month? 0 points 0 points  What time? 0 points 0 points  Count back from 20 0 points 0 points  Months in reverse 0 points 2 points  Repeat phrase 0 points 0 points  Total Score 0 points 2 points    Immunizations Immunization History  Administered Date(s) Administered   Fluad Quad(high Dose 65+) 12/13/2019   Influenza Split 12/10/2010, 01/23/2012   Influenza, High Dose Seasonal PF 02/04/2015, 01/26/2017, 12/18/2017   Influenza-Unspecified 01/16/2016, 12/18/2017   PFIZER(Purple Top)SARS-COV-2 Vaccination 03/02/2019, 03/23/2019, 12/13/2019   PPD Test 01/04/1980   Pneumococcal Conjugate-13 06/03/2013   Pneumococcal-Unspecified 09/04/2007   Td 12/18/2017   Tdap 09/04/2007, 12/18/2017   Zoster Recombinant(Shingrix) 01/31/2018, 04/06/2018   Zoster, Live 12/17/2010    TDAP status: Up to date  Flu Vaccine status: Due, Education has been provided regarding the importance of this vaccine. Advised may receive this vaccine at local pharmacy or Health Dept. Aware to provide a copy of the vaccination record if obtained from local pharmacy or Health Dept. Verbalized acceptance  and understanding.  Pneumococcal vaccine status: Up to date  Covid-19 vaccine status: Information provided on how to obtain vaccines.   Qualifies for Shingles Vaccine? Yes   Zostavax completed Yes   Shingrix Completed?: Yes  Screening Tests Health Maintenance  Topic Date Due    COVID-19 Vaccine (4 - 2023-24 season) 10/15/2021   INFLUENZA VACCINE  09/15/2022   Pneumonia Vaccine 2+ Years old (2 of 2 - PPSV23 or PCV20) 10/14/2022 (Originally 06/04/2014)   Medicare Annual Wellness (AWV)  10/04/2023   DTaP/Tdap/Td (4 - Td or Tdap) 12/19/2027   Hepatitis C Screening  Completed   Zoster Vaccines- Shingrix  Completed   HPV VACCINES  Aged Out   Colonoscopy  Discontinued    Health Maintenance  Health Maintenance Due  Topic Date Due   COVID-19 Vaccine (4 - 2023-24 season) 10/15/2021   INFLUENZA VACCINE  09/15/2022    Colorectal cancer screening: No longer required.   Lung Cancer Screening: (Low Dose CT Chest recommended if Age 48-80 years, 20 pack-year currently smoking OR have quit w/in 15years.) does not qualify.   Lung Cancer Screening Referral: no  Additional Screening:  Hepatitis C Screening: does qualify; Completed 04/19/2016  Vision Screening: Recommended annual ophthalmology exams for early detection of glaucoma and other disorders of the eye. Is the patient up to date with their annual eye exam?  Yes  Who is the provider or what is the name of the office in which the patient attends annual eye exams? [facility] If pt is not established with a provider, would they like to be referred to a provider to establish care? No .   Dental Screening: Recommended annual dental exams for proper oral hygiene  Diabetic Foot Exam: n/a  Community Resource Referral / Chronic Care Management: CRR required this visit?  No   CCM required this visit?  No     Plan:     I have personally reviewed and noted the following in the patient's chart:   Medical and social history Use of alcohol, tobacco or illicit drugs  Current medications and supplements including opioid prescriptions. Patient is not currently taking opioid prescriptions. Functional ability and status Nutritional status Physical activity Advanced directives List of other  physicians Hospitalizations, surgeries, and ER visits in previous 12 months Vitals Screenings to include cognitive, depression, and falls Referrals and appointments  In addition, I have reviewed and discussed with patient certain preventive protocols, quality metrics, and best practice recommendations. A written personalized care plan for preventive services as well as general preventive health recommendations were provided to patient.     Barb Merino, LPN   5/62/1308   After Visit Summary: in person  Nurse Notes: none

## 2022-10-19 ENCOUNTER — Ambulatory Visit: Payer: Medicare PPO | Admitting: Family Medicine

## 2022-10-19 ENCOUNTER — Encounter: Payer: Self-pay | Admitting: Family Medicine

## 2022-10-19 VITALS — BP 124/78 | HR 80 | Ht 68.5 in | Wt 224.6 lb

## 2022-10-19 DIAGNOSIS — Z0000 Encounter for general adult medical examination without abnormal findings: Secondary | ICD-10-CM

## 2022-10-19 DIAGNOSIS — N401 Enlarged prostate with lower urinary tract symptoms: Secondary | ICD-10-CM | POA: Diagnosis not present

## 2022-10-19 DIAGNOSIS — N529 Male erectile dysfunction, unspecified: Secondary | ICD-10-CM

## 2022-10-19 DIAGNOSIS — M1990 Unspecified osteoarthritis, unspecified site: Secondary | ICD-10-CM

## 2022-10-19 DIAGNOSIS — R3911 Hesitancy of micturition: Secondary | ICD-10-CM

## 2022-10-19 DIAGNOSIS — I10 Essential (primary) hypertension: Secondary | ICD-10-CM

## 2022-10-19 DIAGNOSIS — E669 Obesity, unspecified: Secondary | ICD-10-CM | POA: Diagnosis not present

## 2022-10-19 DIAGNOSIS — E785 Hyperlipidemia, unspecified: Secondary | ICD-10-CM | POA: Diagnosis not present

## 2022-10-19 DIAGNOSIS — N5201 Erectile dysfunction due to arterial insufficiency: Secondary | ICD-10-CM

## 2022-10-19 MED ORDER — FINASTERIDE 5 MG PO TABS: 5.0000 mg | ORAL_TABLET | Freq: Every day | ORAL | 3 refills | Status: AC

## 2022-10-19 MED ORDER — ATORVASTATIN CALCIUM 40 MG PO TABS
40.0000 mg | ORAL_TABLET | Freq: Every day | ORAL | 3 refills | Status: DC
Start: 2022-10-19 — End: 2022-10-20

## 2022-10-19 MED ORDER — LISINOPRIL-HYDROCHLOROTHIAZIDE 10-12.5 MG PO TABS
1.0000 | ORAL_TABLET | Freq: Every day | ORAL | 3 refills | Status: DC
Start: 2022-10-19 — End: 2023-12-11

## 2022-10-19 NOTE — Progress Notes (Signed)
Complete physical exam  Patient: Stephen Padilla   DOB: 1946-01-14   77 y.o. Male  MRN: [id number]  Subjective:    Chief Complaint  Patient presents with   Annual Exam    Fasting annual exam, had AWV 10/04/22.  Has had a cold x 10 days-dry cough and drainage.     Stephen Padilla is a 77 y.o. male who presents today for a complete physical exam. He reports consuming a general diet.  Walks 3 times a week.  He generally feels well. He reports sleeping well. He has had some URI symptoms in the last week with nasal congestion and dry cough but states he is doing okay with that.  He continues on finasteride and is having no difficulty with that.  States that he does not need any refills on Cialis at the present time.  Continues on Zestoretic as well as Lipitor with no complaints.  He does use various OTC meds.  He is retired and enjoying his retirement.  He and his wife are doing a lot of traveling.  Most recent fall risk assessment:    10/04/2022   10:57 AM  Fall Risk   Falls in the past year? 0  Number falls in past yr: 0  Injury with Fall? 0  Risk for fall due to : Medication side effect  Follow up Falls prevention discussed;Falls evaluation completed     Most recent depression screenings:    10/04/2022   11:01 AM 10/01/2021   10:17 AM  PHQ 2/9 Scores  PHQ - 2 Score 0 0  PHQ- 9 Score 0 0    Vision:Within last year     Patient Care Team: Ronnald Nian, MD as PCP - General (Family Medicine)   Outpatient Medications Prior to Visit  Medication Sig   Ascorbic Acid (VITAMIN C) 100 MG tablet    glucosamine-chondroitin 500-400 MG tablet Take 1 tablet by mouth 3 (three) times daily.   Multiple Vitamin (MULTIVITAMIN) capsule Take 1 capsule by mouth daily.   Multiple Vitamins-Minerals (EYE VITAMINS PO) Take 1 tablet by mouth daily.   Turmeric (QC TUMERIC COMPLEX PO) Take by mouth.   [DISCONTINUED] aspirin EC 81 MG tablet Take 81 mg by mouth daily. Takes 1 dailt   albuterol  (VENTOLIN HFA) 108 (90 Base) MCG/ACT inhaler Inhale 2 puffs into the lungs every 6 (six) hours as needed for wheezing or shortness of breath. (Patient not taking: Reported on 10/19/2022)   tadalafil (CIALIS) 20 MG tablet Take 1 tablet (20 mg total) by mouth daily as needed for erectile dysfunction. (Patient not taking: Reported on 10/19/2022)   [DISCONTINUED] atorvastatin (LIPITOR) 40 MG tablet Take 1 tablet by mouth once daily   [DISCONTINUED] finasteride (PROSCAR) 5 MG tablet Take 1 tablet (5 mg total) by mouth daily.   [DISCONTINUED] lisinopril-hydrochlorothiazide (ZESTORETIC) 10-12.5 MG tablet Take 1 tablet by mouth daily.   [DISCONTINUED] predniSONE (DELTASONE) 20 MG tablet Take 2 tablets (40 mg total) by mouth daily with breakfast.   No facility-administered medications prior to visit.    Review of Systems  All other systems reviewed and are negative. Family and social history as well as health maintenance and immunizations was reviewed        Objective:     BP 124/78   Pulse 80   Ht 5' 8.5" (1.74 m)   Wt 224 lb 9.6 oz (101.9 kg)   BMI 33.65 kg/m    Physical Exam  Alert and in no distress.  Tympanic membranes and canals are normal. Pharyngeal area is normal. Neck is supple without adenopathy or thyromegaly. Cardiac exam shows a regular sinus rhythm without murmurs or gallops. Lungs are clear to auscultation.      Assessment & Plan:    Routine general medical examination at a health care facility - Plan: CBC with Differential/Platelet, Comprehensive metabolic panel, Lipid panel  Obesity (BMI 30-39.9)  Primary hypertension - Plan: CBC with Differential/Platelet, Comprehensive metabolic panel  Hyperlipidemia with target LDL less than 100 - Plan: Lipid panel, atorvastatin (LIPITOR) 40 MG tablet  Erectile dysfunction due to arterial insufficiency  Benign prostatic hyperplasia with urinary hesitancy - Plan: finasteride (PROSCAR) 5 MG tablet  Arthritis  Erectile  dysfunction, unspecified erectile dysfunction type  Essential hypertension - Plan: lisinopril-hydrochlorothiazide (ZESTORETIC) 10-12.5 MG tablet  Immunization History  Administered Date(s) Administered   Fluad Quad(high Dose 65+) 12/13/2019   Influenza Split 12/10/2010, 01/23/2012   Influenza, High Dose Seasonal PF 02/04/2015, 01/26/2017, 12/18/2017   Influenza-Unspecified 01/16/2016, 12/18/2017   PFIZER(Purple Top)SARS-COV-2 Vaccination 03/02/2019, 03/23/2019, 12/13/2019   PPD Test 01/04/1980   Pneumococcal Conjugate-13 06/03/2013   Pneumococcal-Unspecified 09/04/2007   Td 12/18/2017   Tdap 09/04/2007, 12/18/2017   Zoster Recombinant(Shingrix) 01/31/2018, 04/06/2018   Zoster, Live 12/17/2010    Health Maintenance  Topic Date Due   Pneumonia Vaccine 71+ Years old (2 of 2 - PPSV23 or PCV20) 06/04/2014   COVID-19 Vaccine (4 - 2023-24 season) 11/04/2022 (Originally 10/16/2022)   INFLUENZA VACCINE  05/15/2023 (Originally 09/15/2022)   Medicare Annual Wellness (AWV)  10/04/2023   DTaP/Tdap/Td (4 - Td or Tdap) 12/19/2027   Hepatitis C Screening  Completed   Zoster Vaccines- Shingrix  Completed   HPV VACCINES  Aged Out   Colonoscopy  Discontinued    Discussed health benefits of physical activity, and encouraged him to engage in regular exercise appropriate for his age and condition.  Continue on present medication regimen.  Also discussed RSV, flu and COVID shots at the drugstore.  Problem List Items Addressed This Visit     Arthritis   Benign prostatic hyperplasia with urinary hesitancy   Relevant Medications   finasteride (PROSCAR) 5 MG tablet   Erectile dysfunction   Hyperlipidemia with target LDL less than 100   Relevant Medications   lisinopril-hydrochlorothiazide (ZESTORETIC) 10-12.5 MG tablet   atorvastatin (LIPITOR) 40 MG tablet   Other Relevant Orders   Lipid panel   Hypertension   Relevant Medications   lisinopril-hydrochlorothiazide (ZESTORETIC) 10-12.5 MG tablet    atorvastatin (LIPITOR) 40 MG tablet   Other Relevant Orders   CBC with Differential/Platelet   Comprehensive metabolic panel   Obesity (BMI 69-62.9)   Other Visit Diagnoses     Routine general medical examination at a health care facility    -  Primary   Relevant Orders   CBC with Differential/Platelet   Comprehensive metabolic panel   Lipid panel   Essential hypertension       Relevant Medications   lisinopril-hydrochlorothiazide (ZESTORETIC) 10-12.5 MG tablet   atorvastatin (LIPITOR) 40 MG tablet      Follow-up 1 year    Sharlot Gowda, MD

## 2022-10-20 LAB — CBC WITH DIFFERENTIAL/PLATELET
Basophils Absolute: 0.1 10*3/uL (ref 0.0–0.2)
Basos: 1 %
EOS (ABSOLUTE): 0.4 10*3/uL (ref 0.0–0.4)
Eos: 5 %
Hematocrit: 46.7 % (ref 37.5–51.0)
Hemoglobin: 15.5 g/dL (ref 13.0–17.7)
Immature Grans (Abs): 0 10*3/uL (ref 0.0–0.1)
Immature Granulocytes: 0 %
Lymphocytes Absolute: 1.7 10*3/uL (ref 0.7–3.1)
Lymphs: 25 %
MCH: 29.2 pg (ref 26.6–33.0)
MCHC: 33.2 g/dL (ref 31.5–35.7)
MCV: 88 fL (ref 79–97)
Monocytes Absolute: 0.6 10*3/uL (ref 0.1–0.9)
Monocytes: 8 %
Neutrophils Absolute: 4.1 10*3/uL (ref 1.4–7.0)
Neutrophils: 61 %
Platelets: 204 10*3/uL (ref 150–450)
RBC: 5.3 x10E6/uL (ref 4.14–5.80)
RDW: 14 % (ref 11.6–15.4)
WBC: 6.9 10*3/uL (ref 3.4–10.8)

## 2022-10-20 LAB — LIPID PANEL
Chol/HDL Ratio: 6 ratio — ABNORMAL HIGH (ref 0.0–5.0)
Cholesterol, Total: 265 mg/dL — ABNORMAL HIGH (ref 100–199)
HDL: 44 mg/dL (ref >39)
LDL Chol Calc (NIH): 178 mg/dL — ABNORMAL HIGH (ref 0–99)
Triglycerides: 226 mg/dL — ABNORMAL HIGH (ref 0–149)
VLDL Cholesterol Cal: 43 mg/dL — ABNORMAL HIGH (ref 5–40)

## 2022-10-20 LAB — COMPREHENSIVE METABOLIC PANEL WITH GFR
ALT: 23 IU/L (ref 0–44)
AST: 29 IU/L (ref 0–40)
Albumin: 4.3 g/dL (ref 3.8–4.8)
Alkaline Phosphatase: 93 IU/L (ref 44–121)
BUN/Creatinine Ratio: 16 (ref 10–24)
BUN: 27 mg/dL (ref 8–27)
Bilirubin Total: 0.7 mg/dL (ref 0.0–1.2)
CO2: 23 mmol/L (ref 20–29)
Calcium: 9.5 mg/dL (ref 8.6–10.2)
Chloride: 101 mmol/L (ref 96–106)
Creatinine, Ser: 1.74 mg/dL — ABNORMAL HIGH (ref 0.76–1.27)
Globulin, Total: 2.8 g/dL (ref 1.5–4.5)
Glucose: 101 mg/dL — ABNORMAL HIGH (ref 70–99)
Potassium: 4.3 mmol/L (ref 3.5–5.2)
Sodium: 138 mmol/L (ref 134–144)
Total Protein: 7.1 g/dL (ref 6.0–8.5)
eGFR: 39 mL/min/1.73 — ABNORMAL LOW

## 2022-10-20 MED ORDER — ROSUVASTATIN CALCIUM 40 MG PO TABS: 40.0000 mg | ORAL_TABLET | Freq: Every day | ORAL | 3 refills | Status: DC

## 2022-10-20 NOTE — Addendum Note (Signed)
Addended by: Ronnald Nian on: 10/20/2022 11:06 AM   Modules accepted: Orders

## 2023-01-06 ENCOUNTER — Other Ambulatory Visit: Payer: Self-pay | Admitting: Family Medicine

## 2023-01-06 DIAGNOSIS — I10 Essential (primary) hypertension: Secondary | ICD-10-CM

## 2023-02-28 NOTE — Progress Notes (Signed)
 Start time: 9:41 End time: 10:06  Virtual Visit via Video Note  I connected with Stephen Padilla on 03/01/23 by a video enabled telemedicine application and verified that I am speaking with the correct person using two identifiers.  Location: Patient: home Provider: office   I discussed the limitations of evaluation and management by telemedicine and the availability of in person appointments. The patient expressed understanding and agreed to proceed.  History of Present Illness:  Chief Complaint  Patient presents with   Cough    VIRTUAL cough that started Saturday and is keeping him up at night. He does not have a lot a drainage. No fever, chills or body aches. No home covid tests have been done.    Started coughing over the weekend. 2 nights ago it "hit me real hard", had a hard time sleeping due to cough. Yesterday it settled into his head, not in his chest.  Nose isn't running much.  Nasal drainage is clear. Cough is dry, hacky, nonproductive. He has some pain/pressure around his nose/eyes. Slight sore throat, feels dry. Some shortness of breath with activity--not his norm.  No sick contacts.  Went to a conference over the weekend with lots of people.  Took Delsym yesterday morning. Took alka seltzer daytime 5pm yesterday, and Nyquil at bedtime. Nothing yet today.  Lungs were clear today (wife is an OR nurse), no wheezing. He has albuterol  inhaler at home--gets the same cough every year.  Last filled 02/2022, still has it at home. He last used albuterol  in the Spring. (Visit from 02/2022 reviewed--treated with ABX, but had purulent phlegm at that time).   PMH, PSH, SH reviewed  HTN, HLD, BPH  Outpatient Encounter Medications as of 03/01/2023  Medication Sig Note   Ascorbic Acid (VITAMIN C) 100 MG tablet     benzonatate  (TESSALON ) 200 MG capsule Take 1 capsule (200 mg total) by mouth 3 (three) times daily as needed for cough.    Chlorphen-Phenyleph-ASA (ALKA-SELTZER  PLUS COLD) 2-7.8-325 MG TBEF Take 1 each by mouth as needed. 03/01/2023: Last dose 5pm yesterday   Dextromethorphan HBr (DELSYM PO) Take 10 mLs by mouth as needed. 03/01/2023: Last dose 10am yesterday   finasteride  (PROSCAR ) 5 MG tablet Take 1 tablet (5 mg total) by mouth daily.    glucosamine-chondroitin 500-400 MG tablet Take 1 tablet by mouth 3 (three) times daily.    lisinopril -hydrochlorothiazide  (ZESTORETIC ) 10-12.5 MG tablet Take 1 tablet by mouth daily.    Multiple Vitamin (MULTIVITAMIN) capsule Take 1 capsule by mouth daily.    Multiple Vitamins-Minerals (EYE VITAMINS PO) Take 1 tablet by mouth daily.    Pseudoeph-Doxylamine-DM-APAP (NYQUIL PO) Take 30 mLs by mouth as needed. 03/01/2023: Took last night   rosuvastatin  (CRESTOR ) 40 MG tablet Take 1 tablet (40 mg total) by mouth daily.    Turmeric (QC TUMERIC COMPLEX PO) Take by mouth.    albuterol  (VENTOLIN  HFA) 108 (90 Base) MCG/ACT inhaler Inhale 2 puffs into the lungs every 6 (six) hours as needed for wheezing or shortness of breath. (Patient not taking: Reported on 03/01/2023) 03/01/2023: As needed   tadalafil  (CIALIS ) 20 MG tablet Take 1 tablet (20 mg total) by mouth daily as needed for erectile dysfunction. (Patient not taking: Reported on 03/01/2023) 03/01/2023: As needed   No facility-administered encounter medications on file as of 03/01/2023.   NOT taking tessalon  prior to today  Allergies  Allergen Reactions   Penicillins     REACTION: rash    ROS:No fever or chills.  No ear pain. No chest pain. No v/d. Some nausea this morning No rashes, bleeding or bruising   Observations/Objective:  BP (!) 145/88 Comment: no cuff  Temp (!) 97.5 F (36.4 C) (Oral)   Ht 5' 8.5" (1.74 m)   Wt 223 lb (101.2 kg)   BMI 33.41 kg/m   Pleasant male, with occasional dry cough during visit. He was putting phone/camera to his ear often, so suspect mild hearing troubles. He is speaking comfortably during the visit, not in any distress. Mild  nasal congested-sounding. He is alert and oriented, and cranial nerves are grossly intact. Exam is limited due to the virtual nature of the visit  Assessment and Plan:  BP Readings from Last 3 Encounters:  10/19/22 124/78  10/04/22 120/70  10/13/21 126/80   Be sure to stay well hydrated (goal is a very pale yellow urine). I recommend taking Mucinex (plain) 12 hour, twice daily.  This is an expectorant that will loosen up your mucus and phlegm and should help with your cough and sinus pressure. Take the Delsym syrup twice daily (cough suppressant). If you're running low on Delsym, you can stop taking it, and instead of getting PLAIN mucinex, you can get the Mucinex DM 12 hour version (this contains the same medication as the Delsym, so don't take both). I recommend using a nasal sinus rinse up to twice daily (either [employer] sinus rinse kit or Neti-pot). This should help with sinus pressure (without raising your blood pressure, like the decongestant), and likely help with cough, if postnasal drainage is contributing.  Use the albuterol  inhaler every 4-6 hours as needed for any shortness of breath or wheezing.  Since your blood pressure was high today, please do not take any Alka Seltzer cold medication. (If your blood pressure is normal, you could potentially take this, if needed, but monitor your blood pressure while using it, and don't take it if your blood pressure is 140/90 or higher.  Contact us  if you develop fever, worsening cough, pain with breathing, worsening shortness of breath. Contact us  if you develop discolored mucus or phlegm that persists throughout the day (it is expected that you may have some discolored mucus or phlegm once things start to loosen up, after starting the Mucinex.).    Follow Up Instructions:     I discussed the assessment and treatment plan with the patient. The patient was provided an opportunity to ask questions and all were answered. The patient  agreed with the plan and demonstrated an understanding of the instructions.   The patient was advised to call back or seek an in-person evaluation if the symptoms worsen or if the condition fails to improve as anticipated.  I spent 26 minutes dedicated to the care of this patient, including pre-visit review of records, face to face time, post-visit ordering of testing and documentation.    Paulett Boros, MD

## 2023-03-01 ENCOUNTER — Encounter: Payer: Self-pay | Admitting: Family Medicine

## 2023-03-01 ENCOUNTER — Telehealth (INDEPENDENT_AMBULATORY_CARE_PROVIDER_SITE_OTHER): Payer: Medicare PPO | Admitting: Family Medicine

## 2023-03-01 VITALS — BP 145/88 | Temp 97.5°F | Ht 68.5 in | Wt 223.0 lb

## 2023-03-01 DIAGNOSIS — R051 Acute cough: Secondary | ICD-10-CM | POA: Diagnosis not present

## 2023-03-01 MED ORDER — BENZONATATE 200 MG PO CAPS
200.0000 mg | ORAL_CAPSULE | Freq: Three times a day (TID) | ORAL | 0 refills | Status: DC | PRN
Start: 2023-03-01 — End: 2023-12-14

## 2023-03-01 NOTE — Patient Instructions (Signed)
 Be sure to stay well hydrated (goal is a very pale yellow urine). I recommend taking Mucinex (plain) 12 hour, twice daily.  This is an expectorant that will loosen up your mucus and phlegm and should help with your cough and sinus pressure. Take the Delsym syrup twice daily (cough suppressant). If you're running low on Delsym, you can stop taking it, and instead of getting PLAIN mucinex, you can get the Mucinex DM 12 hour version (this contains the same medication as the Delsym, so don't take both). I recommend using a nasal sinus rinse up to twice daily (either [employer] sinus rinse kit or Neti-pot). This should help with sinus pressure (without raising your blood pressure, like the decongestant), and likely help with cough, if postnasal drainage is contributing.  Use the albuterol  inhaler every 4-6 hours as needed for any shortness of breath or wheezing.  Since your blood pressure was high today, please do not take any Alka Seltzer cold medication. (If your blood pressure is normal, like 120/70's like you usually have, you could potentially take this, if needed, but monitor your blood pressure while using it, and don't take it if your blood pressure is 140/90 or higher.)  Contact us  if you develop fever, worsening cough, pain with breathing, worsening shortness of breath. Contact us  if you develop discolored mucus or phlegm that persists throughout the day (it is expected that you may have some discolored mucus or phlegm once things start to loosen up, after starting the Mucinex.).

## 2023-03-15 DIAGNOSIS — R1013 Epigastric pain: Secondary | ICD-10-CM | POA: Diagnosis not present

## 2023-04-11 ENCOUNTER — Encounter: Payer: Self-pay | Admitting: Internal Medicine

## 2023-05-05 DIAGNOSIS — Z961 Presence of intraocular lens: Secondary | ICD-10-CM | POA: Diagnosis not present

## 2023-05-05 DIAGNOSIS — H04123 Dry eye syndrome of bilateral lacrimal glands: Secondary | ICD-10-CM | POA: Diagnosis not present

## 2023-05-05 DIAGNOSIS — H353131 Nonexudative age-related macular degeneration, bilateral, early dry stage: Secondary | ICD-10-CM | POA: Diagnosis not present

## 2023-05-05 DIAGNOSIS — H348322 Tributary (branch) retinal vein occlusion, left eye, stable: Secondary | ICD-10-CM | POA: Diagnosis not present

## 2023-09-05 ENCOUNTER — Ambulatory Visit

## 2023-09-05 DIAGNOSIS — Z0000 Encounter for general adult medical examination without abnormal findings: Secondary | ICD-10-CM | POA: Diagnosis not present

## 2023-09-05 NOTE — Progress Notes (Signed)
 Subjective:   Stephen Padilla is a 78 y.o. who presents for a Medicare Wellness preventive visit.  As a reminder, Annual Wellness Visits don't include a physical exam, and some assessments may be limited, especially if this visit is performed virtually. We may recommend an in-person follow-up visit with your provider if needed.  Visit Complete: Virtual I connected with  Stephen Padilla on 09/05/23 by a audio enabled telemedicine application and verified that I am speaking with the correct person using two identifiers.  Patient Location: Home  Provider Location: Office/Clinic  I discussed the limitations of evaluation and management by telemedicine. The patient expressed understanding and agreed to proceed.  Vital Signs: Because this visit was a virtual/telehealth visit, some criteria may be missing or patient reported. Any vitals not documented were not able to be obtained and vitals that have been documented are patient reported.  VideoError- Librarian, academic were attempted between this provider and patient, however failed, due to patient having technical difficulties OR patient did not have access to video capability.  We continued and completed visit with audio only.   Persons Participating in Visit: Patient.  AWV Questionnaire: Yes: Patient Medicare AWV questionnaire was completed by the patient on 09/04/2023; I have confirmed that all information answered by patient is correct and no changes since this date.  Cardiac Risk Factors include: advanced age (>74men, >87 women);dyslipidemia;hypertension;male gender     Objective:    Today's Vitals   There is no height or weight on file to calculate BMI.     09/05/2023    2:10 PM 10/04/2022   10:56 AM 10/01/2021   10:15 AM 09/08/2020   10:41 AM 09/05/2019   10:47 AM 06/12/2018   10:55 AM 05/04/2017    9:52 AM  Advanced Directives  Does Patient Have a Medical Advance Directive? Yes Yes Yes Yes Yes Yes  Yes   Type of Estate agent of [place];Living will Healthcare Power of [place];Living will Healthcare Power of [place];Living will Living will;Healthcare Power of Attorney Living will Healthcare Power of [place];Living will   Does patient want to make changes to medical advance directive?    No - Patient declined Yes (ED - Information included in AVS) No - Patient declined  No - Patient declined   Copy of Healthcare Power of Attorney in Chart? Yes - validated most recent copy scanned in chart (See row information) Yes - validated most recent copy scanned in chart (See row information) Yes - validated most recent copy scanned in chart (See row information) Yes - validated most recent copy scanned in chart (See row information)  No - copy requested       Data saved with a previous flowsheet row definition    Current Medications (verified) Outpatient Encounter Medications as of 09/05/2023  Medication Sig   Ascorbic Acid (VITAMIN C) 100 MG tablet    Dextromethorphan HBr (DELSYM PO) Take 10 mLs by mouth as needed.   finasteride  (PROSCAR ) 5 MG tablet Take 1 tablet (5 mg total) by mouth daily.   glucosamine-chondroitin 500-400 MG tablet Take 1 tablet by mouth 3 (three) times daily.   lisinopril -hydrochlorothiazide  (ZESTORETIC ) 10-12.5 MG tablet Take 1 tablet by mouth daily.   Multiple Vitamin (MULTIVITAMIN) capsule Take 1 capsule by mouth daily.   Multiple Vitamins-Minerals (EYE VITAMINS PO) Take 1 tablet by mouth daily.   rosuvastatin  (CRESTOR ) 40 MG tablet Take 1 tablet (40 mg total) by mouth daily.   Turmeric (QC TUMERIC COMPLEX PO)  Take by mouth.   albuterol  (VENTOLIN  HFA) 108 (90 Base) MCG/ACT inhaler Inhale 2 puffs into the lungs every 6 (six) hours as needed for wheezing or shortness of breath. (Patient not taking: Reported on 09/05/2023)   benzonatate  (TESSALON ) 200 MG capsule Take 1 capsule (200 mg total) by mouth 3 (three) times daily as needed for cough.    Chlorphen-Phenyleph-ASA (ALKA-SELTZER PLUS COLD) 2-7.8-325 MG TBEF Take 1 each by mouth as needed.   Pseudoeph-Doxylamine-DM-APAP (NYQUIL PO) Take 30 mLs by mouth as needed.   tadalafil  (CIALIS ) 20 MG tablet Take 1 tablet (20 mg total) by mouth daily as needed for erectile dysfunction. (Patient not taking: Reported on 09/05/2023)   No facility-administered encounter medications on file as of 09/05/2023.    Allergies (verified) Penicillins   History: Past Medical History:  Diagnosis Date   Allergy    RHINITIS   Alopecia    Arthritis    Cataract    Hemorrhoids    Hypercholesterolemia    Hypertension    Obesity    Past Surgical History:  Procedure Laterality Date   EYE SURGERY  2011   BILAT CATARACTS   Family History  Problem Relation Age of Onset   Hypertension Mother    Heart disease Sister    Colon cancer Neg Hx    Esophageal cancer Neg Hx    Rectal cancer Neg Hx    Stomach cancer Neg Hx    Social History   Socioeconomic History   Marital status: Married    Spouse name: Not on file   Number of children: Not on file   Years of education: Not on file   Highest education level: Not on file  Occupational History   Not on file  Tobacco Use   Smoking status: Never   Smokeless tobacco: Never  Vaping Use   Vaping status: Never Used  Substance and Sexual Activity   Alcohol use: Yes    Comment: occasionally, maybe 1-2 drinks socially (usually once a month)   Drug use: No   Sexual activity: Yes  Other Topics Concern   Not on file  Social History Narrative   Not on file   Social Drivers of Health   Financial Resource Strain: Low Risk  (09/05/2023)   Overall Financial Resource Strain (CARDIA)    Difficulty of Paying Living Expenses: Not hard at all  Food Insecurity: No Food Insecurity (09/05/2023)   Hunger Vital Sign    Worried About Running Out of Food in the Last Year: Never true    Ran Out of Food in the Last Year: Never true  Transportation Needs: No  Transportation Needs (09/05/2023)   PRAPARE - Administrator, Civil Service (Medical): No    Lack of Transportation (Non-Medical): No  Physical Activity: Inactive (09/05/2023)   Exercise Vital Sign    Days of Exercise per Week: 0 days    Minutes of Exercise per Session: 0 min  Stress: No Stress Concern Present (09/05/2023)   [employer] of Occupational Health - Occupational Stress Questionnaire    Feeling of Stress: Not at all  Social Connections: Socially Integrated (09/05/2023)   Social Connection and Isolation Panel    Frequency of Communication with Friends and Family: Twice a week    Frequency of Social Gatherings with Friends and Family: Once a week    Attends Religious Services: More than 4 times per year    Active Member of [employer] or Organizations: Yes    Attends Banker  Meetings: More than 4 times per year    Marital Status: Married    Tobacco Counseling Counseling given: Not Answered    Clinical Intake:  Pre-visit preparation completed: Yes  Pain : No/denies pain     Nutritional Risks: None Diabetes: No  Lab Results  Component Value Date   HGBA1C 6.3 (A) 10/13/2021   HGBA1C 6.4 (H) 09/08/2020   HGBA1C 6.2 (H) 06/03/2013     How often do you need to have someone help you when you read instructions, pamphlets, or other written materials from your doctor or pharmacy?: 1 - Never  Interpreter Needed?: No  Information entered by :: NAllen LPN   Activities of Daily Living     09/04/2023    2:40 PM 10/04/2022   10:51 AM  In your present state of health, do you have any difficulty performing the following activities:  Hearing? 0 0  Vision? 0 0  Difficulty concentrating or making decisions? 0 0  Walking or climbing stairs? 0 0  Dressing or bathing? 0 0  Doing errands, shopping? 0 0  Preparing Food and eating ? N N  Using the Toilet? N N  In the past six months, have you accidently leaked urine? N N  Do you have problems with  loss of bowel control? N N  Managing your Medications? N N  Managing your Finances? N N  Housekeeping or managing your Housekeeping? N N    Patient Care Team: Joyce Norleen BROCKS, MD as PCP - General (Family Medicine)  I have updated your Care Teams any recent Medical Services you may have received from other providers in the past year.     Assessment:   This is a routine wellness examination for [place].  Hearing/Vision screen Hearing Screening - Comments:: Denies hearing issues Vision Screening - Comments:: Regular eye exams, Dr. Marcey   Goals Addressed             This Visit's Progress    Patient Stated       09/05/2023, denies goals       Depression Screen     09/05/2023    2:11 PM 10/04/2022   11:01 AM 10/01/2021   10:17 AM 09/08/2020   10:42 AM 09/05/2019   10:50 AM 06/12/2018    9:14 AM 05/04/2017    9:32 AM  PHQ 2/9 Scores  PHQ - 2 Score 0 0 0 0 0 0 0  PHQ- 9 Score 1 0 0        Fall Risk     09/04/2023    2:40 PM 10/04/2022   10:57 AM 10/01/2021   10:17 AM 09/08/2020   10:42 AM 09/05/2019   10:49 AM  Fall Risk   Falls in the past year? 0 0 0 0 0  Number falls in past yr: 0 0 0 0   Injury with Fall? 0 0 0 0   Risk for fall due to : Medication side effect Medication side effect Medication side effect No Fall Risks No Fall Risks  Follow up Falls prevention discussed;Falls evaluation completed Falls prevention discussed;Falls evaluation completed Falls evaluation completed;Education provided;Falls prevention discussed  Falls evaluation completed       Data saved with a previous flowsheet row definition    MEDICARE RISK AT HOME:  Medicare Risk at Home Any stairs in or around the home?: (Patient-Rptd) Yes If so, are there any without handrails?: (Patient-Rptd) No Home free of loose throw rugs in walkways, pet beds, electrical cords, etc?: (Patient-Rptd)  Yes Adequate lighting in your home to reduce risk of falls?: (Patient-Rptd) Yes Life alert?: (Patient-Rptd)  No Use of a cane, walker or w/c?: (Patient-Rptd) No Grab bars in the bathroom?: (Patient-Rptd) No Shower chair or bench in shower?: (Patient-Rptd) Yes Elevated toilet seat or a handicapped toilet?: (Patient-Rptd) Yes  TIMED UP AND GO:  Was the test performed?  No  Cognitive Function: 6CIT completed        09/05/2023    2:12 PM 10/04/2022   11:01 AM 10/01/2021   10:20 AM  6CIT Screen  What Year? 0 points 0 points 0 points  What month? 0 points 0 points 0 points  What time? 0 points 0 points 0 points  Count back from 20 0 points 0 points 0 points  Months in reverse 0 points 0 points 2 points  Repeat phrase 0 points 0 points 0 points  Total Score 0 points 0 points 2 points    Immunizations Immunization History  Administered Date(s) Administered   Fluad Quad(high Dose 65+) 12/13/2019   Influenza Split 12/10/2010, 01/23/2012   Influenza, High Dose Seasonal PF 02/04/2015, 01/26/2017, 12/18/2017   Influenza-Unspecified 01/16/2016, 12/18/2017   PFIZER(Purple Top)SARS-COV-2 Vaccination 03/02/2019, 03/23/2019, 12/13/2019   PPD Test 01/04/1980   Pneumococcal Conjugate-13 06/03/2013   Pneumococcal-Unspecified 09/04/2007   Td 12/18/2017   Tdap 09/04/2007, 12/18/2017   Zoster Recombinant(Shingrix) 01/31/2018, 04/06/2018   Zoster, Live 12/17/2010    Screening Tests Health Maintenance  Topic Date Due   Pneumococcal Vaccine: 50+ Years (2 of 2 - PCV20 or PCV21) 06/04/2014   COVID-19 Vaccine (4 - 2024-25 season) 10/16/2022   INFLUENZA VACCINE  09/15/2023   Medicare Annual Wellness (AWV)  09/04/2024   DTaP/Tdap/Td (4 - Td or Tdap) 12/19/2027   Zoster Vaccines- Shingrix  Completed   Hepatitis B Vaccines  Aged Out   HPV VACCINES  Aged Out   Meningococcal B Vaccine  Aged Out   Colonoscopy  Discontinued   Hepatitis C Screening  Discontinued    Health Maintenance  Health Maintenance Due  Topic Date Due   Pneumococcal Vaccine: 50+ Years (2 of 2 - PCV20 or PCV21) 06/04/2014    COVID-19 Vaccine (4 - 2024-25 season) 10/16/2022   Health Maintenance Items Addressed: Declines covid vaccines. Due for pneumonia vaccine.  Additional Screening:  Vision Screening: Recommended annual ophthalmology exams for early detection of glaucoma and other disorders of the eye. Would you like a referral to an eye doctor? No    Dental Screening: Recommended annual dental exams for proper oral hygiene  Community Resource Referral / Chronic Care Management: CRR required this visit?  No   CCM required this visit?  No   Plan:    I have personally reviewed and noted the following in the patient's chart:   Medical and social history Use of alcohol, tobacco or illicit drugs  Current medications and supplements including opioid prescriptions. Patient is not currently taking opioid prescriptions. Functional ability and status Nutritional status Physical activity Advanced directives List of other physicians Hospitalizations, surgeries, and ER visits in previous 12 months Vitals Screenings to include cognitive, depression, and falls Referrals and appointments  In addition, I have reviewed and discussed with patient certain preventive protocols, quality metrics, and best practice recommendations. A written personalized care plan for preventive services as well as general preventive health recommendations were provided to patient.   Ardella FORBES Dawn, LPN   [id number]   After Visit Summary: (MyChart) Due to this being a telephonic visit, the after visit summary with  patients personalized plan was offered to patient via MyChart   Notes: Nothing significant to report at this time.

## 2023-09-05 NOTE — Patient Instructions (Signed)
 [name] , Thank you for taking time out of your busy schedule to complete your Annual Wellness Visit with me. I enjoyed our conversation and look forward to speaking with you again next year. I, as well as your care team,  appreciate your ongoing commitment to your health goals. Please review the following plan we discussed and let me know if I can assist you in the future. Your Game plan/ To Do List    Referrals: If you haven't heard from the office you've been referred to, please reach out to them at the phone provided.  N/a Follow up Visits: Next Medicare AWV with our clinical staff: 09/17/2024 at 2:10   Have you seen your provider in the last 6 months (3 months if uncontrolled diabetes)? Yes Next Office Visit with your provider: 11/28/2023 at 9:45  Clinician Recommendations:  Aim for 30 minutes of exercise or brisk walking, 6-8 glasses of water, and 5 servings of fruits and vegetables each day.       This is a list of the screening recommended for you and due dates:  Health Maintenance  Topic Date Due   Pneumococcal Vaccine for age over 25 (2 of 2 - PCV20 or PCV21) 06/04/2014   COVID-19 Vaccine (4 - 2024-25 season) 10/16/2022   Flu Shot  09/15/2023   Medicare Annual Wellness Visit  09/04/2024   DTaP/Tdap/Td vaccine (4 - Td or Tdap) 12/19/2027   Zoster (Shingles) Vaccine  Completed   Hepatitis B Vaccine  Aged Out   HPV Vaccine  Aged Out   Meningitis B Vaccine  Aged Out   Colon Cancer Screening  Discontinued   Hepatitis C Screening  Discontinued    Advanced directives: (In Chart) A copy of your advanced directives are scanned into your chart should your provider ever need it. Advance Care Planning is important because it:  [x]  Makes sure you receive the medical care that is consistent with your values, goals, and preferences  [x]  It provides guidance to your family and loved ones and reduces their decisional burden about whether or not they are making the right decisions based  on your wishes.  Follow the link provided in your after visit summary or read over the paperwork we have mailed to you to help you started getting your Advance Directives in place. If you need assistance in completing these, please reach out to us  so that we can help you!  See attachments for Preventive Care and Fall Prevention Tips.

## 2023-11-28 ENCOUNTER — Ambulatory Visit: Payer: Medicare PPO | Admitting: Family Medicine

## 2023-12-05 ENCOUNTER — Other Ambulatory Visit: Payer: Self-pay | Admitting: Family Medicine

## 2023-12-05 DIAGNOSIS — E785 Hyperlipidemia, unspecified: Secondary | ICD-10-CM

## 2023-12-10 ENCOUNTER — Other Ambulatory Visit: Payer: Self-pay | Admitting: Family Medicine

## 2023-12-10 DIAGNOSIS — N401 Enlarged prostate with lower urinary tract symptoms: Secondary | ICD-10-CM

## 2023-12-11 ENCOUNTER — Other Ambulatory Visit: Payer: Self-pay | Admitting: Family Medicine

## 2023-12-11 DIAGNOSIS — I10 Essential (primary) hypertension: Secondary | ICD-10-CM

## 2023-12-13 ENCOUNTER — Ambulatory Visit (INDEPENDENT_AMBULATORY_CARE_PROVIDER_SITE_OTHER): Admitting: Family Medicine

## 2023-12-13 ENCOUNTER — Ambulatory Visit
Admission: RE | Admit: 2023-12-13 | Discharge: 2023-12-13 | Disposition: A | Discharge status: Home / Self Care | Source: Ambulatory Visit | Attending: Family Medicine | Admitting: Family Medicine

## 2023-12-13 ENCOUNTER — Encounter: Payer: Self-pay | Admitting: Family Medicine

## 2023-12-13 VITALS — BP 124/84 | HR 82 | Ht 69.0 in | Wt 234.8 lb

## 2023-12-13 DIAGNOSIS — M1812 Unilateral primary osteoarthritis of first carpometacarpal joint, left hand: Secondary | ICD-10-CM | POA: Diagnosis not present

## 2023-12-13 DIAGNOSIS — M180 Bilateral primary osteoarthritis of first carpometacarpal joints: Secondary | ICD-10-CM

## 2023-12-13 DIAGNOSIS — N5201 Erectile dysfunction due to arterial insufficiency: Secondary | ICD-10-CM

## 2023-12-13 DIAGNOSIS — Z0000 Encounter for general adult medical examination without abnormal findings: Secondary | ICD-10-CM

## 2023-12-13 DIAGNOSIS — E785 Hyperlipidemia, unspecified: Secondary | ICD-10-CM

## 2023-12-13 DIAGNOSIS — M1811 Unilateral primary osteoarthritis of first carpometacarpal joint, right hand: Secondary | ICD-10-CM | POA: Diagnosis not present

## 2023-12-13 DIAGNOSIS — Z23 Encounter for immunization: Secondary | ICD-10-CM

## 2023-12-13 DIAGNOSIS — E669 Obesity, unspecified: Secondary | ICD-10-CM | POA: Diagnosis not present

## 2023-12-13 DIAGNOSIS — I10 Essential (primary) hypertension: Secondary | ICD-10-CM | POA: Diagnosis not present

## 2023-12-13 DIAGNOSIS — R0982 Postnasal drip: Secondary | ICD-10-CM

## 2023-12-13 LAB — POCT GLYCOSYLATED HEMOGLOBIN (HGB A1C): Hemoglobin A1C: 7.1 % — AB (ref 4.0–5.6)

## 2023-12-13 LAB — LIPID PANEL

## 2023-12-13 MED ORDER — FLUTICASONE PROPIONATE 50 MCG/ACT NA SUSP: 2.0000 | Freq: Every day | NASAL | 6 refills | Status: AC

## 2023-12-13 NOTE — Progress Notes (Signed)
 Name: Stephen Padilla   Date of Visit: 12/13/23   Date of last visit with me: Visit date not found   CHIEF COMPLAINT:  Chief Complaint  Patient presents with   Annual Exam    Cpe.       HPI:  Discussed the use of AI scribe software for clinical note transcription with the patient, who gave verbal consent to proceed.  History of Present Illness   [name] is an 78 year old male who presents with a persistent morning cough.  He experiences a persistent cough every morning, which he attributes to his blood pressure medication, lisinopril . The cough subsides by the evening and is described as a 'tickle in the back of the throat.' He has been on lisinopril  for a long time.  He has a history of hypertension, managed with lisinopril , and hyperlipidemia, for which he takes Crestor . He previously took Lipitor but no longer uses it.  He reports arthritis in both thumbs, which has progressed to the point where he struggles to grip objects, such as his Yeti cup, without a handle. He experiences significant pain, impacting his daily activities.  No urinary issues.         OBJECTIVE:       12/13/2023   10:22 AM  Depression screen PHQ 2/9  Decreased Interest 0  Down, Depressed, Hopeless 0  PHQ - 2 Score 0     BP Readings from Last 3 Encounters:  12/13/23 124/84  03/01/23 (!) 145/88  10/19/22 124/78    BP 124/84   Pulse 82   Ht 5' 9 (1.753 m)   Wt 234 lb 12.8 oz (106.5 kg)   SpO2 98%   BMI 34.67 kg/m    Physical Exam          Physical Exam Constitutional:      Appearance: Normal appearance.  Neurological:     General: No focal deficit present.     Mental Status: He is alert and oriented to person, place, and time. Mental status is at baseline.     ASSESSMENT/PLAN:   Assessment & Plan Need for pneumococcal 20-valent conjugate vaccination  Flu vaccine need  Annual physical exam  Post-nasal drip  Primary osteoarthritis of both first  carpometacarpal joints  Obesity (BMI 30-39.9)  Hyperlipidemia with target LDL less than 100  Essential hypertension  Erectile dysfunction due to arterial insufficiency    Assessment and Plan     Annual Physical  -Comprehensive annual physical exam completed today. Reviewed interval history, current medical issues, medications, allergies, and preventive care needs. Addressed all patient questions and concerns. Discussed lifestyle factors including diet, exercise, sleep, and stress management. Reviewed recommended age-appropriate screenings, labs, and vaccinations. Counseling provided on healthy habits and routine health maintenance. Follow-up as indicated based on findings and results. -PCV and flu vaccine today  Osteoarthritis of bilateral first carpometacarpal (CMC) joints Osteoarthritis in bilateral CMC joints causing significant pain and functional impairment, particularly in gripping objects. - Order x-rays of bilateral hands to assess severity. - Schedule corticosteroid injection in one CMC joint to evaluate efficacy. - Use ultrasound guidance for injection placement. - Plan follow-up to assess response and consider treatment for other hand if successful.  Postnasal drip (chronic rhinitis) Chronic postnasal drip likely due to sinus inflammation, exacerbated by lying flat at night. Differential diagnosis includes potential side effect from lisinopril , but chronicity and symptom pattern suggest postnasal drip as primary cause. - Initiate Flonase nasal spray nightly. - Reassess in 2-3  weeks for effectiveness.  Essential hypertension Hypertension well-controlled on current medication regimen.  Hyperlipidemia Hyperlipidemia managed with Crestor .         Ura Yingling A. Vita MD [facility]

## 2023-12-13 NOTE — Patient Instructions (Signed)
 Please go get your xrays done at [facility]. You do not need to make an appointment. You can just show up.   Address: [address] [place], [place], [place] [address]

## 2023-12-14 ENCOUNTER — Encounter: Payer: Self-pay | Admitting: Family Medicine

## 2023-12-14 ENCOUNTER — Ambulatory Visit (INDEPENDENT_AMBULATORY_CARE_PROVIDER_SITE_OTHER): Admitting: Family Medicine

## 2023-12-14 VITALS — BP 124/82 | HR 88 | Wt 234.0 lb

## 2023-12-14 DIAGNOSIS — M19041 Primary osteoarthritis, right hand: Secondary | ICD-10-CM

## 2023-12-14 DIAGNOSIS — E119 Type 2 diabetes mellitus without complications: Secondary | ICD-10-CM

## 2023-12-14 DIAGNOSIS — M19049 Primary osteoarthritis, unspecified hand: Secondary | ICD-10-CM | POA: Diagnosis not present

## 2023-12-14 LAB — COMPREHENSIVE METABOLIC PANEL WITH GFR
ALT: 26 IU/L (ref 0–44)
AST: 33 IU/L (ref 0–40)
Albumin: 4.1 g/dL (ref 3.8–4.8)
Alkaline Phosphatase: 87 IU/L (ref 47–123)
BUN/Creatinine Ratio: 16 (ref 10–24)
BUN: 27 mg/dL (ref 8–27)
Bilirubin Total: 0.6 mg/dL (ref 0.0–1.2)
CO2: 20 mmol/L (ref 20–29)
Calcium: 10.1 mg/dL (ref 8.6–10.2)
Chloride: 102 mmol/L (ref 96–106)
Creatinine, Ser: 1.73 mg/dL — AB (ref 0.76–1.27)
Globulin, Total: 3.1 g/dL (ref 1.5–4.5)
Glucose: 175 mg/dL — AB (ref 70–99)
Potassium: 4 mmol/L (ref 3.5–5.2)
Sodium: 139 mmol/L (ref 134–144)
Total Protein: 7.2 g/dL (ref 6.0–8.5)
eGFR: 39 mL/min/1.73 — AB (ref >59)

## 2023-12-14 LAB — CBC WITH DIFFERENTIAL/PLATELET
Basophils Absolute: 0.1 x10E3/uL (ref 0.0–0.2)
Basos: 1 %
EOS (ABSOLUTE): 0.3 x10E3/uL (ref 0.0–0.4)
Eos: 5 %
Hematocrit: 44.8 % (ref 37.5–51.0)
Hemoglobin: 14.2 g/dL (ref 13.0–17.7)
Immature Grans (Abs): 0 x10E3/uL (ref 0.0–0.1)
Immature Granulocytes: 0 %
Lymphocytes Absolute: 1.6 x10E3/uL (ref 0.7–3.1)
Lymphs: 26 %
MCH: 28.9 pg (ref 26.6–33.0)
MCHC: 31.7 g/dL (ref 31.5–35.7)
MCV: 91 fL (ref 79–97)
Monocytes Absolute: 0.5 x10E3/uL (ref 0.1–0.9)
Monocytes: 8 %
Neutrophils Absolute: 3.9 x10E3/uL (ref 1.4–7.0)
Neutrophils: 60 %
Platelets: 183 x10E3/uL (ref 150–450)
RBC: 4.92 x10E6/uL (ref 4.14–5.80)
RDW: 14.4 % (ref 11.6–15.4)
WBC: 6.4 x10E3/uL (ref 3.4–10.8)

## 2023-12-14 LAB — LIPID PANEL
Cholesterol, Total: 145 mg/dL (ref 100–199)
HDL: 44 mg/dL (ref >39)
LDL CALC COMMENT:: 3.3 ratio (ref 0.0–5.0)
LDL Chol Calc (NIH): 68 mg/dL (ref 0–99)
Triglycerides: 197 mg/dL — AB (ref 0–149)
VLDL Cholesterol Cal: 33 mg/dL (ref 5–40)

## 2023-12-14 LAB — PSA: Prostate Specific Ag, Serum: 1.6 ng/mL (ref 0.0–4.0)

## 2023-12-14 MED ORDER — TIRZEPATIDE 2.5 MG/0.5ML ~~LOC~~ SOAJ: 2.5000 mg | SUBCUTANEOUS | 0 refills | Status: AC

## 2023-12-14 NOTE — Progress Notes (Signed)
 Name: Stephen Padilla   Date of Visit: 12/14/23   Date of last visit with me: 12/13/2023   CHIEF COMPLAINT:  Chief Complaint  Patient presents with   Procedure    Procedure.        HPI:  Discussed the use of AI scribe software for clinical note transcription with the patient, who gave verbal consent to proceed.  History of Present Illness   Stephen Padilla is an 78 year old male with diabetes and arthritis who presents for management of high A1c and joint pain.  He has a high A1c level. He is not currently on any diabetes medication but is familiar with metformin.  He experiences joint pain, particularly in the carpometacarpal [facility]) joint of his thumb, which sometimes results in numbness of the whole thumb. The pain is persistent, with flare-ups lasting a couple of months. He uses Voltaren gel for relief, although it is less effective when the pain is severe.  He resides in [place] and plans to travel to [place]  and [place] in the coming weeks. He is not currently on blood thinners and has been taken off baby aspirin in the past.         OBJECTIVE:       12/13/2023   10:22 AM  Depression screen PHQ 2/9  Decreased Interest 0  Down, Depressed, Hopeless 0  PHQ - 2 Score 0     BP Readings from Last 3 Encounters:  12/14/23 124/82  12/13/23 124/84  03/01/23 (!) 145/88    BP 124/82   Pulse 88   Wt 234 lb (106.1 kg)   SpO2 98%   BMI 34.56 kg/m    Physical Exam   MUSCULOSKELETAL: Right hand CMC joint with TTP, mild swelling noted. ROM full.       Physical Exam  ASSESSMENT/PLAN:   Assessment & Plan Type 2 diabetes mellitus without complication, without long-term current use of insulin (HCC)  CMC arthritis    Assessment and Plan    Right thumb carpometacarpal (CMC) joint osteoarthritis with effusion Chronic osteoarthritis with effusion causing numbness and pain. Steroid injection administered for inflammation and pain reduction. Immediate  relief from numbing medicine confirmed correct placement. Steroid expected to take effect in two days. - Administer steroid injection to the right thumb CMC joint to reduce inflammation and pain. - Advise use of Voltaren gel for mild pain management when the joint is less inflamed.  Type 2 diabetes mellitus Elevated A1c related to obesity. Pharmacotherapy initiated to manage blood glucose. Metformin prescribed for A1c maintenance. Ozempic ordered for weight loss and diabetes control. - Prescribe metformin to help maintain current A1c levels and manage blood glucose. - Order Mounjaro (GLP-1 agonist) to assist with weight loss and further control of diabetes. - Schedule follow-up appointment three weeks after starting Ozempic to assess tolerance and adjust dosage.  Obesity Obesity contributing to elevated A1c and type 2 diabetes. Weight management crucial for health improvement. Ozempic initiated for weight loss as part of diabetes management. - Initiate Ozempic to aid in weight loss as part of diabetes management plan.      Procedure: Ultrasound-guided CMC joint injection, right thumb After informed verbal consent was obtained a timeout was performed, patient was seated on exam table. The patient's right hand was placed on stable surface with medial hand on table.  Identification of the [facility] joint was performed in long axis under ultrasound guidance. Utilizing an in-plane approach, a 25-gauge, 5/8 needle was inserted under ultrasound guidance  utilizing walk-down technique and visualized with needle entrance into the [facility] joint. The joint was subsequently injected with a 1:1 cc of lidocaine :celestone combination with physical visitation of injectate spread into the joint space.  Patient tolerated the procedure well without immediate complications.    Clennon Nasca A. Vita MD [facility]

## 2023-12-28 NOTE — Progress Notes (Signed)
   12/28/2023  Patient ID: Stephen Padilla, male   DOB: Feb 09, 1946, 78 y.o.   MRN: [id number]  Pharmacy Quality Measure Review  This patient is appearing on a report for being at risk of failing the adherence measure for hypertension (ACEi/ARB) medications this calendar year.   Medication: Lisinopril -hydrochlorothiazide   Last fill date: 12/11/23 for 90 day supply  Insurance report was not up to date. No action needed at this time.   Jon VEAR [place], PharmD Clinical Pharmacist [phone]

## 2024-01-05 ENCOUNTER — Ambulatory Visit: Admitting: Family Medicine

## 2024-01-05 ENCOUNTER — Encounter: Payer: Self-pay | Admitting: Family Medicine

## 2024-01-05 VITALS — BP 128/82 | HR 77 | Wt 232.0 lb

## 2024-01-05 DIAGNOSIS — K219 Gastro-esophageal reflux disease without esophagitis: Secondary | ICD-10-CM | POA: Insufficient documentation

## 2024-01-05 DIAGNOSIS — Z7985 Long-term (current) use of injectable non-insulin antidiabetic drugs: Secondary | ICD-10-CM

## 2024-01-05 DIAGNOSIS — E119 Type 2 diabetes mellitus without complications: Secondary | ICD-10-CM | POA: Insufficient documentation

## 2024-01-05 DIAGNOSIS — M19049 Primary osteoarthritis, unspecified hand: Secondary | ICD-10-CM | POA: Diagnosis not present

## 2024-01-05 MED ORDER — TIRZEPATIDE 5 MG/0.5ML ~~LOC~~ SOAJ: 5.0000 mg | SUBCUTANEOUS | 1 refills | Status: DC

## 2024-01-05 MED ORDER — FAMOTIDINE 20 MG PO TABS: 20.0000 mg | ORAL_TABLET | Freq: Two times a day (BID) | ORAL | 1 refills | Status: AC

## 2024-01-05 NOTE — Progress Notes (Signed)
   Name: Stephen Padilla   Date of Visit: 01/05/24   Date of last visit with me: 12/14/2023   CHIEF COMPLAINT:  Chief Complaint  Patient presents with   Follow-up    Follow up on CMC injection. Joint is doing good, much better. Weight loss medication is doing good getting a lot of acid reflux, been taking esomeprazole.        HPI:  Discussed the use of AI scribe software for clinical note transcription with the patient, who gave verbal consent to proceed.  History of Present Illness   Stephen Padilla is an 78 year old male who presents with medication-induced reflux.  He has been experiencing significant reflux symptoms, particularly after starting a new medication. The reflux is severe enough to prevent him from eating during a recent trip to [place]. His wife, who also suffers from reflux, provided him with a pill that alleviated his symptoms immediately.  He has not previously taken any medication for reflux other than Tums. His appetite is okay, but he has lost a couple of pounds. He is currently on the lowest dose of his medication, 2.5 mg, and has one dose left.  He mentions a noticeable decrease in cravings for sugary foods and carbohydrates since starting the medication, which he attributes to the medication's effects.   His CMC arthritis pain is significantly better.         OBJECTIVE:       12/13/2023   10:22 AM  Depression screen PHQ 2/9  Decreased Interest 0  Down, Depressed, Hopeless 0  PHQ - 2 Score 0     BP Readings from Last 3 Encounters:  01/05/24 128/82  12/14/23 124/82  12/13/23 124/84    BP 128/82   Pulse 77   Wt 232 lb (105.2 kg)   SpO2 97%   BMI 34.26 kg/m    Physical Exam          Physical Exam Constitutional:      Appearance: Normal appearance.  Neurological:     General: No focal deficit present.     Mental Status: He is alert and oriented to person, place, and time. Mental status is at baseline.      ASSESSMENT/PLAN:   Assessment & Plan Type 2 diabetes mellitus without complication, without long-term current use of insulin (HCC)  Diabetes mellitus treated with injections of non-insulin medication (HCC)  Gastroesophageal reflux disease without esophagitis  CMC arthritis    Assessment and Plan    Gastroesophageal reflux disease Reflux likely due to medication and decreased food intake, exacerbated during travel. - Prescribed Pepcid  (famotidine ) once or twice daily as needed. - Advised morning and evening dosing if symptoms persist. - Discussed stronger medication if no improvement.  Type 2 diabetes mellitus Good medication tolerance, reduced cravings, weight loss noted. Plan to increase medication dosage for efficacy. - Continue current medication, increase dose to 5 mg after current supply. - Report back in three weeks for assessment. - Plan to increase to 7.5 mg, potential up to 12.5 mg if needed. - Encouraged hydration and reduced food intake for weight loss and glycemic control.         Hollie Bartus A. Vita MD [facility]

## 2024-01-24 ENCOUNTER — Other Ambulatory Visit: Payer: Self-pay

## 2024-01-24 ENCOUNTER — Ambulatory Visit: Payer: Self-pay

## 2024-01-24 DIAGNOSIS — E119 Type 2 diabetes mellitus without complications: Secondary | ICD-10-CM

## 2024-01-24 MED ORDER — BLOOD GLUCOSE TEST VI STRP: 1.0000 | ORAL_STRIP | 0 refills | Status: AC

## 2024-01-24 MED ORDER — LANCET DEVICE MISC: 1.0000 | 0 refills | Status: AC

## 2024-01-24 MED ORDER — BLOOD GLUCOSE MONITORING SUPPL DEVI: 1.0000 | 0 refills | Status: AC

## 2024-01-24 MED ORDER — LANCETS MISC: 1.0000 | 0 refills | Status: AC

## 2024-01-24 NOTE — Telephone Encounter (Signed)
 Sent Blood Glucose Monitor in to [facility].

## 2024-01-24 NOTE — Telephone Encounter (Signed)
 Called Patient, He had called this AM because he had dizziness. He thinks sugar levels have been low that has caused these feelings. I tried to get him in for an appt, he doesn't want to come in yet. He will call if he starts feeling worse. He does not have a blood glucose monitor, so he's not really sure what his levels are.

## 2024-01-24 NOTE — Telephone Encounter (Signed)
 FYI Only or Action Required?: Action required by provider: update on patient condition.  Patient was last seen in primary care on 01/05/2024 by Vita Morrow, MD.  Called Nurse Triage reporting Medication Problem and Dizziness.  Symptoms began several days ago.  Interventions attempted: Rest, hydration, or home remedies.  Symptoms are: stable.  Triage Disposition: Call PCP Within 24 Hours  Patient/caregiver understands and will follow disposition?: Yes       Copied from CRM #[id number]. Topic: Clinical - Red Word Triage >> Jan 24, 2024 11:36 AM Hadassah PARAS wrote: Red Word that prompted transfer to Nurse Triage: Pt began tirzepatide  (MOUNJARO ) 5 MG/0.5ML Pen , pt is experiencing lightheadedness, just temporary. Believes blood sugar is low Reason for Disposition  [1] Follow-up call from patient regarding patient's clinical status AND [2] information NON-URGENT  Answer Assessment - Initial Assessment Questions 1. REASON FOR CALL or QUESTION: What is your reason for calling today? or How can I best    Patient called into triage with an FYI for provider. Patient states he is on his second round of Monjaro from the 2.5mg  dose; now 5mg . He states the lightheadedness is not current. But it is is intermittent. He states it only happens when he stands suddenly. The last time he felt this was last night and it only lasted for one minute. He was advised to let the provider know if  he had any symptoms. Please advise.  Protocols used: PCP Call - No Triage-A-AH

## 2024-02-06 NOTE — Progress Notes (Signed)
 Stephen Padilla                                          MRN: [id number]   02/06/2024   The [facility] Quality Team Specialist reviewed this patient medical record for the purposes of chart review for care gap closure. The following were reviewed: chart review for care gap closure-kidney health evaluation for diabetes:eGFR  and uACR.    [facility] Quality Team

## 2024-03-11 ENCOUNTER — Other Ambulatory Visit: Payer: Self-pay | Admitting: Family Medicine

## 2024-03-11 DIAGNOSIS — E119 Type 2 diabetes mellitus without complications: Secondary | ICD-10-CM

## 2024-03-15 ENCOUNTER — Other Ambulatory Visit: Payer: Self-pay | Admitting: Family Medicine

## 2024-03-15 DIAGNOSIS — I10 Essential (primary) hypertension: Secondary | ICD-10-CM

## 2024-04-22 ENCOUNTER — Ambulatory Visit: Admitting: Family Medicine

## 2024-09-17 ENCOUNTER — Ambulatory Visit: Payer: Self-pay

## 2024-12-12 ENCOUNTER — Encounter: Admitting: Family Medicine
